# Patient Record
Sex: Male | Born: 1955
Health system: Southern US, Community
[De-identification: ages and names within clinical notes are randomized; demographics above are authoritative.]

## PROBLEM LIST (undated history)

## (undated) DIAGNOSIS — E785 Hyperlipidemia, unspecified: Secondary | ICD-10-CM

## (undated) DIAGNOSIS — I10 Essential (primary) hypertension: Secondary | ICD-10-CM

## (undated) DIAGNOSIS — M623 Immobility syndrome (paraplegic): Secondary | ICD-10-CM

## (undated) HISTORY — DX: Essential (primary) hypertension: I10

## (undated) HISTORY — PX: BACK SURGERY: SHX140

## (undated) HISTORY — DX: Hyperlipidemia, unspecified: E78.5

## (undated) HISTORY — DX: Immobility syndrome (paraplegic): M62.3

---

## 1998-10-07 ENCOUNTER — Inpatient Hospital Stay (HOSPITAL_COMMUNITY)
Admission: RE | Admit: 1998-10-07 | Discharge: 1998-10-30 | Payer: Self-pay | Admitting: Physical Medicine and Rehabilitation

## 1998-10-13 ENCOUNTER — Encounter: Payer: Self-pay | Admitting: Physical Medicine and Rehabilitation

## 1998-10-19 ENCOUNTER — Encounter: Payer: Self-pay | Admitting: Physical Medicine and Rehabilitation

## 1998-12-14 ENCOUNTER — Encounter
Admission: RE | Admit: 1998-12-14 | Discharge: 1999-03-14 | Payer: Self-pay | Admitting: Physical Medicine and Rehabilitation

## 2006-10-17 HISTORY — PX: COLONOSCOPY: SHX174

## 2011-12-21 DIAGNOSIS — D1739 Benign lipomatous neoplasm of skin and subcutaneous tissue of other sites: Secondary | ICD-10-CM | POA: Diagnosis not present

## 2012-01-03 DIAGNOSIS — R350 Frequency of micturition: Secondary | ICD-10-CM | POA: Diagnosis not present

## 2012-01-03 DIAGNOSIS — R509 Fever, unspecified: Secondary | ICD-10-CM | POA: Diagnosis not present

## 2012-01-05 DIAGNOSIS — G8220 Paraplegia, unspecified: Secondary | ICD-10-CM | POA: Diagnosis not present

## 2012-01-09 DIAGNOSIS — S63096A Other dislocation of unspecified wrist and hand, initial encounter: Secondary | ICD-10-CM | POA: Diagnosis not present

## 2012-01-09 DIAGNOSIS — S63509A Unspecified sprain of unspecified wrist, initial encounter: Secondary | ICD-10-CM | POA: Diagnosis not present

## 2012-01-09 DIAGNOSIS — M25539 Pain in unspecified wrist: Secondary | ICD-10-CM | POA: Diagnosis not present

## 2012-01-31 DIAGNOSIS — M5030 Other cervical disc degeneration, unspecified cervical region: Secondary | ICD-10-CM | POA: Diagnosis not present

## 2012-01-31 DIAGNOSIS — M5412 Radiculopathy, cervical region: Secondary | ICD-10-CM | POA: Diagnosis not present

## 2012-02-06 DIAGNOSIS — M5030 Other cervical disc degeneration, unspecified cervical region: Secondary | ICD-10-CM | POA: Diagnosis not present

## 2012-02-07 DIAGNOSIS — M5030 Other cervical disc degeneration, unspecified cervical region: Secondary | ICD-10-CM | POA: Diagnosis not present

## 2012-02-13 DIAGNOSIS — R6889 Other general symptoms and signs: Secondary | ICD-10-CM | POA: Diagnosis not present

## 2012-02-13 DIAGNOSIS — E041 Nontoxic single thyroid nodule: Secondary | ICD-10-CM | POA: Diagnosis not present

## 2012-02-15 DIAGNOSIS — E041 Nontoxic single thyroid nodule: Secondary | ICD-10-CM | POA: Diagnosis not present

## 2012-02-15 DIAGNOSIS — R6889 Other general symptoms and signs: Secondary | ICD-10-CM | POA: Diagnosis not present

## 2012-03-26 DIAGNOSIS — G890 Central pain syndrome: Secondary | ICD-10-CM | POA: Diagnosis not present

## 2012-03-26 DIAGNOSIS — M5030 Other cervical disc degeneration, unspecified cervical region: Secondary | ICD-10-CM | POA: Diagnosis not present

## 2012-03-26 DIAGNOSIS — M5137 Other intervertebral disc degeneration, lumbosacral region: Secondary | ICD-10-CM | POA: Diagnosis not present

## 2012-04-09 DIAGNOSIS — D485 Neoplasm of uncertain behavior of skin: Secondary | ICD-10-CM | POA: Diagnosis not present

## 2012-04-24 DIAGNOSIS — D2360 Other benign neoplasm of skin of unspecified upper limb, including shoulder: Secondary | ICD-10-CM | POA: Diagnosis not present

## 2012-04-26 DIAGNOSIS — M25529 Pain in unspecified elbow: Secondary | ICD-10-CM | POA: Diagnosis not present

## 2012-05-02 DIAGNOSIS — F4321 Adjustment disorder with depressed mood: Secondary | ICD-10-CM | POA: Diagnosis not present

## 2012-05-02 DIAGNOSIS — F4542 Pain disorder with related psychological factors: Secondary | ICD-10-CM | POA: Diagnosis not present

## 2012-05-09 DIAGNOSIS — F4321 Adjustment disorder with depressed mood: Secondary | ICD-10-CM | POA: Diagnosis not present

## 2012-05-09 DIAGNOSIS — F4542 Pain disorder with related psychological factors: Secondary | ICD-10-CM | POA: Diagnosis not present

## 2012-05-17 DIAGNOSIS — M25529 Pain in unspecified elbow: Secondary | ICD-10-CM | POA: Diagnosis not present

## 2012-06-05 DIAGNOSIS — G890 Central pain syndrome: Secondary | ICD-10-CM | POA: Diagnosis not present

## 2012-06-05 DIAGNOSIS — G8220 Paraplegia, unspecified: Secondary | ICD-10-CM | POA: Diagnosis not present

## 2012-06-05 DIAGNOSIS — IMO0002 Reserved for concepts with insufficient information to code with codable children: Secondary | ICD-10-CM | POA: Diagnosis not present

## 2012-07-12 DIAGNOSIS — M5137 Other intervertebral disc degeneration, lumbosacral region: Secondary | ICD-10-CM | POA: Diagnosis not present

## 2012-07-12 DIAGNOSIS — G894 Chronic pain syndrome: Secondary | ICD-10-CM | POA: Diagnosis not present

## 2012-07-12 DIAGNOSIS — G890 Central pain syndrome: Secondary | ICD-10-CM | POA: Diagnosis not present

## 2012-08-07 DIAGNOSIS — G894 Chronic pain syndrome: Secondary | ICD-10-CM | POA: Diagnosis not present

## 2012-08-07 DIAGNOSIS — M961 Postlaminectomy syndrome, not elsewhere classified: Secondary | ICD-10-CM | POA: Diagnosis not present

## 2012-08-07 DIAGNOSIS — R109 Unspecified abdominal pain: Secondary | ICD-10-CM | POA: Diagnosis not present

## 2012-08-07 DIAGNOSIS — M5137 Other intervertebral disc degeneration, lumbosacral region: Secondary | ICD-10-CM | POA: Diagnosis not present

## 2012-09-28 DIAGNOSIS — G8220 Paraplegia, unspecified: Secondary | ICD-10-CM | POA: Diagnosis not present

## 2012-09-28 DIAGNOSIS — E782 Mixed hyperlipidemia: Secondary | ICD-10-CM | POA: Diagnosis not present

## 2012-09-28 DIAGNOSIS — Z125 Encounter for screening for malignant neoplasm of prostate: Secondary | ICD-10-CM | POA: Diagnosis not present

## 2012-09-28 DIAGNOSIS — Z23 Encounter for immunization: Secondary | ICD-10-CM | POA: Diagnosis not present

## 2012-09-28 DIAGNOSIS — M25539 Pain in unspecified wrist: Secondary | ICD-10-CM | POA: Diagnosis not present

## 2012-09-28 DIAGNOSIS — Z79899 Other long term (current) drug therapy: Secondary | ICD-10-CM | POA: Diagnosis not present

## 2012-10-01 DIAGNOSIS — M79609 Pain in unspecified limb: Secondary | ICD-10-CM | POA: Diagnosis not present

## 2012-10-01 DIAGNOSIS — M249 Joint derangement, unspecified: Secondary | ICD-10-CM | POA: Diagnosis not present

## 2012-10-11 DIAGNOSIS — M47812 Spondylosis without myelopathy or radiculopathy, cervical region: Secondary | ICD-10-CM | POA: Diagnosis not present

## 2012-10-11 DIAGNOSIS — M24139 Other articular cartilage disorders, unspecified wrist: Secondary | ICD-10-CM | POA: Diagnosis not present

## 2012-10-19 DIAGNOSIS — S56819A Strain of other muscles, fascia and tendons at forearm level, unspecified arm, initial encounter: Secondary | ICD-10-CM | POA: Diagnosis not present

## 2012-10-19 DIAGNOSIS — M47812 Spondylosis without myelopathy or radiculopathy, cervical region: Secondary | ICD-10-CM | POA: Diagnosis not present

## 2012-10-19 DIAGNOSIS — S53499A Other sprain of unspecified elbow, initial encounter: Secondary | ICD-10-CM | POA: Diagnosis not present

## 2012-10-19 DIAGNOSIS — M25539 Pain in unspecified wrist: Secondary | ICD-10-CM | POA: Diagnosis not present

## 2013-01-15 DIAGNOSIS — N529 Male erectile dysfunction, unspecified: Secondary | ICD-10-CM | POA: Diagnosis not present

## 2013-01-15 DIAGNOSIS — N319 Neuromuscular dysfunction of bladder, unspecified: Secondary | ICD-10-CM | POA: Diagnosis not present

## 2013-01-15 DIAGNOSIS — Z125 Encounter for screening for malignant neoplasm of prostate: Secondary | ICD-10-CM | POA: Diagnosis not present

## 2013-05-09 DIAGNOSIS — N319 Neuromuscular dysfunction of bladder, unspecified: Secondary | ICD-10-CM | POA: Diagnosis not present

## 2013-05-09 DIAGNOSIS — G8220 Paraplegia, unspecified: Secondary | ICD-10-CM | POA: Diagnosis not present

## 2013-05-10 DIAGNOSIS — Z79899 Other long term (current) drug therapy: Secondary | ICD-10-CM | POA: Diagnosis not present

## 2013-05-10 DIAGNOSIS — T83091A Other mechanical complication of indwelling urethral catheter, initial encounter: Secondary | ICD-10-CM | POA: Diagnosis not present

## 2013-05-10 DIAGNOSIS — Z466 Encounter for fitting and adjustment of urinary device: Secondary | ICD-10-CM | POA: Diagnosis not present

## 2013-05-10 DIAGNOSIS — R339 Retention of urine, unspecified: Secondary | ICD-10-CM | POA: Diagnosis not present

## 2013-05-10 DIAGNOSIS — Y846 Urinary catheterization as the cause of abnormal reaction of the patient, or of later complication, without mention of misadventure at the time of the procedure: Secondary | ICD-10-CM | POA: Diagnosis not present

## 2013-05-10 DIAGNOSIS — T8389XA Other specified complication of genitourinary prosthetic devices, implants and grafts, initial encounter: Secondary | ICD-10-CM | POA: Diagnosis not present

## 2013-05-10 DIAGNOSIS — R3989 Other symptoms and signs involving the genitourinary system: Secondary | ICD-10-CM | POA: Diagnosis not present

## 2013-05-13 DIAGNOSIS — N390 Urinary tract infection, site not specified: Secondary | ICD-10-CM | POA: Diagnosis not present

## 2013-05-23 DIAGNOSIS — N529 Male erectile dysfunction, unspecified: Secondary | ICD-10-CM | POA: Diagnosis not present

## 2013-05-23 DIAGNOSIS — N390 Urinary tract infection, site not specified: Secondary | ICD-10-CM | POA: Diagnosis not present

## 2013-05-23 DIAGNOSIS — N319 Neuromuscular dysfunction of bladder, unspecified: Secondary | ICD-10-CM | POA: Diagnosis not present

## 2013-08-16 DIAGNOSIS — N3000 Acute cystitis without hematuria: Secondary | ICD-10-CM | POA: Diagnosis not present

## 2013-08-16 DIAGNOSIS — L570 Actinic keratosis: Secondary | ICD-10-CM | POA: Diagnosis not present

## 2013-08-16 DIAGNOSIS — E079 Disorder of thyroid, unspecified: Secondary | ICD-10-CM | POA: Diagnosis not present

## 2013-09-03 DIAGNOSIS — N3000 Acute cystitis without hematuria: Secondary | ICD-10-CM | POA: Diagnosis not present

## 2013-09-06 DIAGNOSIS — E079 Disorder of thyroid, unspecified: Secondary | ICD-10-CM | POA: Diagnosis not present

## 2013-09-06 DIAGNOSIS — E049 Nontoxic goiter, unspecified: Secondary | ICD-10-CM | POA: Diagnosis not present

## 2013-09-06 DIAGNOSIS — E041 Nontoxic single thyroid nodule: Secondary | ICD-10-CM | POA: Diagnosis not present

## 2013-09-06 DIAGNOSIS — E042 Nontoxic multinodular goiter: Secondary | ICD-10-CM | POA: Diagnosis not present

## 2013-09-30 DIAGNOSIS — I10 Essential (primary) hypertension: Secondary | ICD-10-CM | POA: Diagnosis not present

## 2013-09-30 DIAGNOSIS — G8220 Paraplegia, unspecified: Secondary | ICD-10-CM | POA: Diagnosis not present

## 2013-09-30 DIAGNOSIS — E782 Mixed hyperlipidemia: Secondary | ICD-10-CM | POA: Diagnosis not present

## 2013-09-30 DIAGNOSIS — L299 Pruritus, unspecified: Secondary | ICD-10-CM | POA: Diagnosis not present

## 2013-09-30 DIAGNOSIS — K219 Gastro-esophageal reflux disease without esophagitis: Secondary | ICD-10-CM | POA: Diagnosis not present

## 2013-09-30 DIAGNOSIS — Z125 Encounter for screening for malignant neoplasm of prostate: Secondary | ICD-10-CM | POA: Diagnosis not present

## 2013-11-13 DIAGNOSIS — L821 Other seborrheic keratosis: Secondary | ICD-10-CM | POA: Diagnosis not present

## 2013-11-13 DIAGNOSIS — C4441 Basal cell carcinoma of skin of scalp and neck: Secondary | ICD-10-CM | POA: Diagnosis not present

## 2014-03-17 DIAGNOSIS — L570 Actinic keratosis: Secondary | ICD-10-CM | POA: Diagnosis not present

## 2014-03-17 DIAGNOSIS — Z85828 Personal history of other malignant neoplasm of skin: Secondary | ICD-10-CM | POA: Diagnosis not present

## 2014-07-09 DIAGNOSIS — J209 Acute bronchitis, unspecified: Secondary | ICD-10-CM | POA: Diagnosis not present

## 2014-07-17 DIAGNOSIS — N400 Enlarged prostate without lower urinary tract symptoms: Secondary | ICD-10-CM | POA: Diagnosis not present

## 2014-07-17 DIAGNOSIS — N319 Neuromuscular dysfunction of bladder, unspecified: Secondary | ICD-10-CM | POA: Diagnosis not present

## 2014-10-02 DIAGNOSIS — Z79899 Other long term (current) drug therapy: Secondary | ICD-10-CM | POA: Diagnosis not present

## 2014-10-02 DIAGNOSIS — K219 Gastro-esophageal reflux disease without esophagitis: Secondary | ICD-10-CM | POA: Diagnosis not present

## 2014-10-02 DIAGNOSIS — Z23 Encounter for immunization: Secondary | ICD-10-CM | POA: Diagnosis not present

## 2014-10-02 DIAGNOSIS — E782 Mixed hyperlipidemia: Secondary | ICD-10-CM | POA: Diagnosis not present

## 2014-10-02 DIAGNOSIS — I10 Essential (primary) hypertension: Secondary | ICD-10-CM | POA: Diagnosis not present

## 2014-10-02 DIAGNOSIS — Z0000 Encounter for general adult medical examination without abnormal findings: Secondary | ICD-10-CM | POA: Diagnosis not present

## 2014-10-02 DIAGNOSIS — G8220 Paraplegia, unspecified: Secondary | ICD-10-CM | POA: Diagnosis not present

## 2015-02-09 DIAGNOSIS — R319 Hematuria, unspecified: Secondary | ICD-10-CM | POA: Diagnosis not present

## 2015-02-09 DIAGNOSIS — Z85828 Personal history of other malignant neoplasm of skin: Secondary | ICD-10-CM | POA: Diagnosis not present

## 2015-02-09 DIAGNOSIS — L570 Actinic keratosis: Secondary | ICD-10-CM | POA: Diagnosis not present

## 2015-02-09 DIAGNOSIS — N319 Neuromuscular dysfunction of bladder, unspecified: Secondary | ICD-10-CM | POA: Diagnosis not present

## 2015-02-09 DIAGNOSIS — N529 Male erectile dysfunction, unspecified: Secondary | ICD-10-CM | POA: Diagnosis not present

## 2015-02-09 DIAGNOSIS — Z08 Encounter for follow-up examination after completed treatment for malignant neoplasm: Secondary | ICD-10-CM | POA: Diagnosis not present

## 2015-02-09 DIAGNOSIS — N200 Calculus of kidney: Secondary | ICD-10-CM | POA: Diagnosis not present

## 2015-02-18 DIAGNOSIS — D485 Neoplasm of uncertain behavior of skin: Secondary | ICD-10-CM | POA: Diagnosis not present

## 2015-02-18 DIAGNOSIS — D171 Benign lipomatous neoplasm of skin and subcutaneous tissue of trunk: Secondary | ICD-10-CM | POA: Diagnosis not present

## 2015-02-18 DIAGNOSIS — D1722 Benign lipomatous neoplasm of skin and subcutaneous tissue of left arm: Secondary | ICD-10-CM | POA: Diagnosis not present

## 2015-06-08 DIAGNOSIS — E782 Mixed hyperlipidemia: Secondary | ICD-10-CM | POA: Diagnosis not present

## 2015-06-08 DIAGNOSIS — I10 Essential (primary) hypertension: Secondary | ICD-10-CM | POA: Diagnosis not present

## 2015-06-08 DIAGNOSIS — Z79899 Other long term (current) drug therapy: Secondary | ICD-10-CM | POA: Diagnosis not present

## 2015-06-08 DIAGNOSIS — R5383 Other fatigue: Secondary | ICD-10-CM | POA: Diagnosis not present

## 2015-09-25 DIAGNOSIS — Z79899 Other long term (current) drug therapy: Secondary | ICD-10-CM | POA: Diagnosis not present

## 2015-09-25 DIAGNOSIS — Z0000 Encounter for general adult medical examination without abnormal findings: Secondary | ICD-10-CM | POA: Diagnosis not present

## 2015-09-25 DIAGNOSIS — G8220 Paraplegia, unspecified: Secondary | ICD-10-CM | POA: Diagnosis not present

## 2015-09-25 DIAGNOSIS — L821 Other seborrheic keratosis: Secondary | ICD-10-CM | POA: Diagnosis not present

## 2015-09-25 DIAGNOSIS — I10 Essential (primary) hypertension: Secondary | ICD-10-CM | POA: Diagnosis not present

## 2015-09-25 DIAGNOSIS — E782 Mixed hyperlipidemia: Secondary | ICD-10-CM | POA: Diagnosis not present

## 2015-09-25 DIAGNOSIS — Z23 Encounter for immunization: Secondary | ICD-10-CM | POA: Diagnosis not present

## 2016-01-05 DIAGNOSIS — Z0000 Encounter for general adult medical examination without abnormal findings: Secondary | ICD-10-CM | POA: Diagnosis not present

## 2016-01-05 DIAGNOSIS — G839 Paralytic syndrome, unspecified: Secondary | ICD-10-CM | POA: Diagnosis not present

## 2016-01-05 DIAGNOSIS — I10 Essential (primary) hypertension: Secondary | ICD-10-CM | POA: Diagnosis not present

## 2016-01-05 DIAGNOSIS — K219 Gastro-esophageal reflux disease without esophagitis: Secondary | ICD-10-CM | POA: Diagnosis not present

## 2016-01-05 DIAGNOSIS — E559 Vitamin D deficiency, unspecified: Secondary | ICD-10-CM | POA: Diagnosis not present

## 2016-01-05 DIAGNOSIS — E782 Mixed hyperlipidemia: Secondary | ICD-10-CM | POA: Diagnosis not present

## 2016-01-05 DIAGNOSIS — Z125 Encounter for screening for malignant neoplasm of prostate: Secondary | ICD-10-CM | POA: Diagnosis not present

## 2016-01-05 DIAGNOSIS — Z79899 Other long term (current) drug therapy: Secondary | ICD-10-CM | POA: Diagnosis not present

## 2016-03-03 DIAGNOSIS — E782 Mixed hyperlipidemia: Secondary | ICD-10-CM | POA: Diagnosis not present

## 2016-03-03 DIAGNOSIS — Z79899 Other long term (current) drug therapy: Secondary | ICD-10-CM | POA: Diagnosis not present

## 2016-03-03 DIAGNOSIS — D519 Vitamin B12 deficiency anemia, unspecified: Secondary | ICD-10-CM | POA: Diagnosis not present

## 2016-03-03 DIAGNOSIS — R5383 Other fatigue: Secondary | ICD-10-CM | POA: Diagnosis not present

## 2016-03-11 DIAGNOSIS — E782 Mixed hyperlipidemia: Secondary | ICD-10-CM | POA: Diagnosis not present

## 2016-03-11 DIAGNOSIS — Z0000 Encounter for general adult medical examination without abnormal findings: Secondary | ICD-10-CM | POA: Diagnosis not present

## 2016-03-11 DIAGNOSIS — R5383 Other fatigue: Secondary | ICD-10-CM | POA: Diagnosis not present

## 2016-04-29 DIAGNOSIS — E291 Testicular hypofunction: Secondary | ICD-10-CM | POA: Diagnosis not present

## 2016-04-29 DIAGNOSIS — Z1389 Encounter for screening for other disorder: Secondary | ICD-10-CM | POA: Diagnosis not present

## 2016-04-29 DIAGNOSIS — Z9181 History of falling: Secondary | ICD-10-CM | POA: Diagnosis not present

## 2016-09-15 DIAGNOSIS — M7582 Other shoulder lesions, left shoulder: Secondary | ICD-10-CM | POA: Diagnosis not present

## 2016-09-15 DIAGNOSIS — E782 Mixed hyperlipidemia: Secondary | ICD-10-CM | POA: Diagnosis not present

## 2016-09-15 DIAGNOSIS — I10 Essential (primary) hypertension: Secondary | ICD-10-CM | POA: Diagnosis not present

## 2016-09-15 DIAGNOSIS — Z1389 Encounter for screening for other disorder: Secondary | ICD-10-CM | POA: Diagnosis not present

## 2016-10-03 DIAGNOSIS — N319 Neuromuscular dysfunction of bladder, unspecified: Secondary | ICD-10-CM | POA: Diagnosis not present

## 2016-10-27 DIAGNOSIS — H547 Unspecified visual loss: Secondary | ICD-10-CM | POA: Diagnosis not present

## 2016-10-27 DIAGNOSIS — H524 Presbyopia: Secondary | ICD-10-CM | POA: Diagnosis not present

## 2016-10-27 DIAGNOSIS — H5203 Hypermetropia, bilateral: Secondary | ICD-10-CM | POA: Diagnosis not present

## 2016-11-21 DIAGNOSIS — M25512 Pain in left shoulder: Secondary | ICD-10-CM | POA: Diagnosis not present

## 2016-11-21 DIAGNOSIS — Z9181 History of falling: Secondary | ICD-10-CM | POA: Diagnosis not present

## 2016-11-21 DIAGNOSIS — Z1389 Encounter for screening for other disorder: Secondary | ICD-10-CM | POA: Diagnosis not present

## 2016-11-28 DIAGNOSIS — M7542 Impingement syndrome of left shoulder: Secondary | ICD-10-CM | POA: Diagnosis not present

## 2017-01-06 DIAGNOSIS — Z79899 Other long term (current) drug therapy: Secondary | ICD-10-CM | POA: Diagnosis not present

## 2017-01-06 DIAGNOSIS — E782 Mixed hyperlipidemia: Secondary | ICD-10-CM | POA: Diagnosis not present

## 2017-01-06 DIAGNOSIS — Z13820 Encounter for screening for osteoporosis: Secondary | ICD-10-CM | POA: Diagnosis not present

## 2017-01-06 DIAGNOSIS — M859 Disorder of bone density and structure, unspecified: Secondary | ICD-10-CM | POA: Diagnosis not present

## 2017-01-06 DIAGNOSIS — G90529 Complex regional pain syndrome I of unspecified lower limb: Secondary | ICD-10-CM | POA: Diagnosis not present

## 2017-01-06 DIAGNOSIS — M8580 Other specified disorders of bone density and structure, unspecified site: Secondary | ICD-10-CM | POA: Diagnosis not present

## 2017-01-06 DIAGNOSIS — Z125 Encounter for screening for malignant neoplasm of prostate: Secondary | ICD-10-CM | POA: Diagnosis not present

## 2017-01-16 DIAGNOSIS — G9050 Complex regional pain syndrome I, unspecified: Secondary | ICD-10-CM | POA: Diagnosis not present

## 2017-01-16 DIAGNOSIS — R0989 Other specified symptoms and signs involving the circulatory and respiratory systems: Secondary | ICD-10-CM | POA: Diagnosis not present

## 2017-01-16 DIAGNOSIS — R202 Paresthesia of skin: Secondary | ICD-10-CM | POA: Diagnosis not present

## 2017-03-17 DIAGNOSIS — T24231A Burn of second degree of right lower leg, initial encounter: Secondary | ICD-10-CM | POA: Diagnosis not present

## 2017-03-17 DIAGNOSIS — G8220 Paraplegia, unspecified: Secondary | ICD-10-CM | POA: Diagnosis not present

## 2017-03-17 DIAGNOSIS — T24031A Burn of unspecified degree of right lower leg, initial encounter: Secondary | ICD-10-CM | POA: Diagnosis not present

## 2017-03-17 DIAGNOSIS — I10 Essential (primary) hypertension: Secondary | ICD-10-CM | POA: Diagnosis not present

## 2017-03-17 DIAGNOSIS — T24331A Burn of third degree of right lower leg, initial encounter: Secondary | ICD-10-CM | POA: Diagnosis not present

## 2017-03-24 DIAGNOSIS — T24331A Burn of third degree of right lower leg, initial encounter: Secondary | ICD-10-CM | POA: Diagnosis not present

## 2017-03-31 DIAGNOSIS — T24331A Burn of third degree of right lower leg, initial encounter: Secondary | ICD-10-CM | POA: Diagnosis not present

## 2017-03-31 DIAGNOSIS — T24301D Burn of third degree of unspecified site of right lower limb, except ankle and foot, subsequent encounter: Secondary | ICD-10-CM | POA: Diagnosis not present

## 2017-03-31 DIAGNOSIS — T310 Burns involving less than 10% of body surface: Secondary | ICD-10-CM | POA: Diagnosis not present

## 2017-04-07 DIAGNOSIS — T24331A Burn of third degree of right lower leg, initial encounter: Secondary | ICD-10-CM | POA: Diagnosis not present

## 2017-04-07 DIAGNOSIS — T24301A Burn of third degree of unspecified site of right lower limb, except ankle and foot, initial encounter: Secondary | ICD-10-CM | POA: Diagnosis not present

## 2017-04-17 DIAGNOSIS — T24331A Burn of third degree of right lower leg, initial encounter: Secondary | ICD-10-CM | POA: Diagnosis not present

## 2017-04-24 DIAGNOSIS — T24031D Burn of unspecified degree of right lower leg, subsequent encounter: Secondary | ICD-10-CM | POA: Diagnosis not present

## 2017-04-24 DIAGNOSIS — Z09 Encounter for follow-up examination after completed treatment for conditions other than malignant neoplasm: Secondary | ICD-10-CM | POA: Diagnosis not present

## 2017-07-19 DIAGNOSIS — Z23 Encounter for immunization: Secondary | ICD-10-CM | POA: Diagnosis not present

## 2017-07-19 DIAGNOSIS — E782 Mixed hyperlipidemia: Secondary | ICD-10-CM | POA: Diagnosis not present

## 2017-07-19 DIAGNOSIS — E291 Testicular hypofunction: Secondary | ICD-10-CM | POA: Diagnosis not present

## 2017-07-19 DIAGNOSIS — I10 Essential (primary) hypertension: Secondary | ICD-10-CM | POA: Diagnosis not present

## 2017-08-21 DIAGNOSIS — K6289 Other specified diseases of anus and rectum: Secondary | ICD-10-CM | POA: Diagnosis not present

## 2017-08-21 DIAGNOSIS — R102 Pelvic and perineal pain: Secondary | ICD-10-CM | POA: Diagnosis not present

## 2017-08-21 DIAGNOSIS — Z9181 History of falling: Secondary | ICD-10-CM | POA: Diagnosis not present

## 2017-08-21 DIAGNOSIS — G8220 Paraplegia, unspecified: Secondary | ICD-10-CM | POA: Diagnosis not present

## 2017-08-25 DIAGNOSIS — L89152 Pressure ulcer of sacral region, stage 2: Secondary | ICD-10-CM | POA: Diagnosis not present

## 2017-09-01 DIAGNOSIS — L89152 Pressure ulcer of sacral region, stage 2: Secondary | ICD-10-CM | POA: Diagnosis not present

## 2017-09-05 DIAGNOSIS — M792 Neuralgia and neuritis, unspecified: Secondary | ICD-10-CM | POA: Diagnosis not present

## 2017-09-05 DIAGNOSIS — G894 Chronic pain syndrome: Secondary | ICD-10-CM | POA: Diagnosis not present

## 2017-09-05 DIAGNOSIS — K6289 Other specified diseases of anus and rectum: Secondary | ICD-10-CM | POA: Diagnosis not present

## 2017-09-05 DIAGNOSIS — R102 Pelvic and perineal pain: Secondary | ICD-10-CM | POA: Diagnosis not present

## 2017-09-08 DIAGNOSIS — L89152 Pressure ulcer of sacral region, stage 2: Secondary | ICD-10-CM | POA: Diagnosis not present

## 2017-09-11 DIAGNOSIS — N3001 Acute cystitis with hematuria: Secondary | ICD-10-CM | POA: Diagnosis not present

## 2017-09-11 DIAGNOSIS — R350 Frequency of micturition: Secondary | ICD-10-CM | POA: Diagnosis not present

## 2017-09-22 DIAGNOSIS — L89152 Pressure ulcer of sacral region, stage 2: Secondary | ICD-10-CM | POA: Diagnosis not present

## 2017-09-29 DIAGNOSIS — R2689 Other abnormalities of gait and mobility: Secondary | ICD-10-CM | POA: Diagnosis not present

## 2017-09-29 DIAGNOSIS — G8220 Paraplegia, unspecified: Secondary | ICD-10-CM | POA: Diagnosis not present

## 2017-10-02 DIAGNOSIS — Z1331 Encounter for screening for depression: Secondary | ICD-10-CM | POA: Diagnosis not present

## 2017-10-02 DIAGNOSIS — M1811 Unilateral primary osteoarthritis of first carpometacarpal joint, right hand: Secondary | ICD-10-CM | POA: Diagnosis not present

## 2017-10-05 DIAGNOSIS — L89152 Pressure ulcer of sacral region, stage 2: Secondary | ICD-10-CM | POA: Diagnosis not present

## 2017-10-27 DIAGNOSIS — L89152 Pressure ulcer of sacral region, stage 2: Secondary | ICD-10-CM | POA: Diagnosis not present

## 2017-10-27 DIAGNOSIS — M623 Immobility syndrome (paraplegic): Secondary | ICD-10-CM | POA: Diagnosis not present

## 2017-11-03 DIAGNOSIS — M623 Immobility syndrome (paraplegic): Secondary | ICD-10-CM | POA: Diagnosis not present

## 2017-11-03 DIAGNOSIS — L89152 Pressure ulcer of sacral region, stage 2: Secondary | ICD-10-CM | POA: Diagnosis not present

## 2017-11-13 DIAGNOSIS — R300 Dysuria: Secondary | ICD-10-CM | POA: Diagnosis not present

## 2017-11-17 DIAGNOSIS — L570 Actinic keratosis: Secondary | ICD-10-CM | POA: Diagnosis not present

## 2017-11-17 DIAGNOSIS — M623 Immobility syndrome (paraplegic): Secondary | ICD-10-CM | POA: Diagnosis not present

## 2017-11-17 DIAGNOSIS — D1721 Benign lipomatous neoplasm of skin and subcutaneous tissue of right arm: Secondary | ICD-10-CM | POA: Diagnosis not present

## 2017-11-17 DIAGNOSIS — L89152 Pressure ulcer of sacral region, stage 2: Secondary | ICD-10-CM | POA: Diagnosis not present

## 2017-11-30 DIAGNOSIS — D485 Neoplasm of uncertain behavior of skin: Secondary | ICD-10-CM | POA: Diagnosis not present

## 2017-11-30 DIAGNOSIS — D1723 Benign lipomatous neoplasm of skin and subcutaneous tissue of right leg: Secondary | ICD-10-CM | POA: Diagnosis not present

## 2017-11-30 DIAGNOSIS — D1721 Benign lipomatous neoplasm of skin and subcutaneous tissue of right arm: Secondary | ICD-10-CM | POA: Diagnosis not present

## 2017-12-01 DIAGNOSIS — L89152 Pressure ulcer of sacral region, stage 2: Secondary | ICD-10-CM | POA: Diagnosis not present

## 2017-12-01 DIAGNOSIS — M623 Immobility syndrome (paraplegic): Secondary | ICD-10-CM | POA: Diagnosis not present

## 2017-12-08 DIAGNOSIS — M623 Immobility syndrome (paraplegic): Secondary | ICD-10-CM | POA: Diagnosis not present

## 2017-12-08 DIAGNOSIS — L89152 Pressure ulcer of sacral region, stage 2: Secondary | ICD-10-CM | POA: Diagnosis not present

## 2017-12-22 DIAGNOSIS — L89152 Pressure ulcer of sacral region, stage 2: Secondary | ICD-10-CM | POA: Diagnosis not present

## 2017-12-22 DIAGNOSIS — M623 Immobility syndrome (paraplegic): Secondary | ICD-10-CM | POA: Diagnosis not present

## 2017-12-29 DIAGNOSIS — M623 Immobility syndrome (paraplegic): Secondary | ICD-10-CM | POA: Diagnosis not present

## 2017-12-29 DIAGNOSIS — L89152 Pressure ulcer of sacral region, stage 2: Secondary | ICD-10-CM | POA: Diagnosis not present

## 2018-01-03 DIAGNOSIS — N400 Enlarged prostate without lower urinary tract symptoms: Secondary | ICD-10-CM | POA: Diagnosis not present

## 2018-01-03 DIAGNOSIS — Z87442 Personal history of urinary calculi: Secondary | ICD-10-CM | POA: Diagnosis not present

## 2018-01-03 DIAGNOSIS — R8290 Unspecified abnormal findings in urine: Secondary | ICD-10-CM | POA: Diagnosis not present

## 2018-01-03 DIAGNOSIS — N319 Neuromuscular dysfunction of bladder, unspecified: Secondary | ICD-10-CM | POA: Diagnosis not present

## 2018-01-09 ENCOUNTER — Encounter (HOSPITAL_BASED_OUTPATIENT_CLINIC_OR_DEPARTMENT_OTHER): Payer: Medicare Other | Attending: Internal Medicine

## 2018-01-09 DIAGNOSIS — L89153 Pressure ulcer of sacral region, stage 3: Secondary | ICD-10-CM | POA: Diagnosis not present

## 2018-01-09 DIAGNOSIS — I10 Essential (primary) hypertension: Secondary | ICD-10-CM | POA: Insufficient documentation

## 2018-01-09 DIAGNOSIS — G8220 Paraplegia, unspecified: Secondary | ICD-10-CM | POA: Insufficient documentation

## 2018-01-11 DIAGNOSIS — N319 Neuromuscular dysfunction of bladder, unspecified: Secondary | ICD-10-CM | POA: Diagnosis not present

## 2018-01-23 ENCOUNTER — Encounter (HOSPITAL_BASED_OUTPATIENT_CLINIC_OR_DEPARTMENT_OTHER): Payer: Medicare Other | Attending: Internal Medicine

## 2018-01-23 DIAGNOSIS — W14XXXS Fall from tree, sequela: Secondary | ICD-10-CM | POA: Insufficient documentation

## 2018-01-23 DIAGNOSIS — S34111S Complete lesion of L1 level of lumbar spinal cord, sequela: Secondary | ICD-10-CM | POA: Diagnosis not present

## 2018-01-23 DIAGNOSIS — L89153 Pressure ulcer of sacral region, stage 3: Secondary | ICD-10-CM | POA: Diagnosis not present

## 2018-02-26 DIAGNOSIS — R69 Illness, unspecified: Secondary | ICD-10-CM | POA: Diagnosis not present

## 2018-02-26 DIAGNOSIS — R300 Dysuria: Secondary | ICD-10-CM | POA: Diagnosis not present

## 2018-02-26 DIAGNOSIS — S61409A Unspecified open wound of unspecified hand, initial encounter: Secondary | ICD-10-CM | POA: Diagnosis not present

## 2018-05-07 DIAGNOSIS — I10 Essential (primary) hypertension: Secondary | ICD-10-CM | POA: Diagnosis not present

## 2018-05-07 DIAGNOSIS — N390 Urinary tract infection, site not specified: Secondary | ICD-10-CM | POA: Diagnosis not present

## 2018-05-07 DIAGNOSIS — E291 Testicular hypofunction: Secondary | ICD-10-CM | POA: Diagnosis not present

## 2018-07-24 DIAGNOSIS — Z23 Encounter for immunization: Secondary | ICD-10-CM | POA: Diagnosis not present

## 2018-07-24 DIAGNOSIS — N390 Urinary tract infection, site not specified: Secondary | ICD-10-CM | POA: Diagnosis not present

## 2018-10-11 DIAGNOSIS — N3000 Acute cystitis without hematuria: Secondary | ICD-10-CM | POA: Diagnosis not present

## 2018-10-12 DIAGNOSIS — K6289 Other specified diseases of anus and rectum: Secondary | ICD-10-CM | POA: Diagnosis not present

## 2018-10-12 DIAGNOSIS — G894 Chronic pain syndrome: Secondary | ICD-10-CM | POA: Diagnosis not present

## 2018-11-02 DIAGNOSIS — K6289 Other specified diseases of anus and rectum: Secondary | ICD-10-CM | POA: Diagnosis not present

## 2018-11-20 DIAGNOSIS — N318 Other neuromuscular dysfunction of bladder: Secondary | ICD-10-CM | POA: Diagnosis not present

## 2018-11-20 DIAGNOSIS — Z87440 Personal history of urinary (tract) infections: Secondary | ICD-10-CM | POA: Diagnosis not present

## 2018-11-20 DIAGNOSIS — N35814 Other anterior urethral stricture, male: Secondary | ICD-10-CM | POA: Diagnosis not present

## 2018-11-20 DIAGNOSIS — N319 Neuromuscular dysfunction of bladder, unspecified: Secondary | ICD-10-CM | POA: Diagnosis not present

## 2018-11-20 DIAGNOSIS — Z87442 Personal history of urinary calculi: Secondary | ICD-10-CM | POA: Diagnosis not present

## 2018-11-20 DIAGNOSIS — Z969 Presence of functional implant, unspecified: Secondary | ICD-10-CM | POA: Diagnosis not present

## 2018-11-26 DIAGNOSIS — K6289 Other specified diseases of anus and rectum: Secondary | ICD-10-CM | POA: Diagnosis not present

## 2018-11-26 DIAGNOSIS — G894 Chronic pain syndrome: Secondary | ICD-10-CM | POA: Diagnosis not present

## 2018-11-26 DIAGNOSIS — M792 Neuralgia and neuritis, unspecified: Secondary | ICD-10-CM | POA: Diagnosis not present

## 2018-11-26 DIAGNOSIS — G8929 Other chronic pain: Secondary | ICD-10-CM | POA: Diagnosis not present

## 2018-11-26 DIAGNOSIS — S24113A Complete lesion at T7-T10 level of thoracic spinal cord, initial encounter: Secondary | ICD-10-CM | POA: Diagnosis not present

## 2018-12-18 DIAGNOSIS — Z0000 Encounter for general adult medical examination without abnormal findings: Secondary | ICD-10-CM | POA: Diagnosis not present

## 2018-12-18 DIAGNOSIS — Z125 Encounter for screening for malignant neoplasm of prostate: Secondary | ICD-10-CM | POA: Diagnosis not present

## 2018-12-18 DIAGNOSIS — G8220 Paraplegia, unspecified: Secondary | ICD-10-CM | POA: Diagnosis not present

## 2018-12-18 DIAGNOSIS — E782 Mixed hyperlipidemia: Secondary | ICD-10-CM | POA: Diagnosis not present

## 2018-12-18 DIAGNOSIS — Z23 Encounter for immunization: Secondary | ICD-10-CM | POA: Diagnosis not present

## 2018-12-18 DIAGNOSIS — Z79899 Other long term (current) drug therapy: Secondary | ICD-10-CM | POA: Diagnosis not present

## 2018-12-18 DIAGNOSIS — I10 Essential (primary) hypertension: Secondary | ICD-10-CM | POA: Diagnosis not present

## 2018-12-18 DIAGNOSIS — E785 Hyperlipidemia, unspecified: Secondary | ICD-10-CM | POA: Diagnosis not present

## 2019-02-27 DIAGNOSIS — N319 Neuromuscular dysfunction of bladder, unspecified: Secondary | ICD-10-CM | POA: Diagnosis not present

## 2019-06-25 DIAGNOSIS — S61002A Unspecified open wound of left thumb without damage to nail, initial encounter: Secondary | ICD-10-CM | POA: Diagnosis not present

## 2019-07-29 DIAGNOSIS — N3001 Acute cystitis with hematuria: Secondary | ICD-10-CM | POA: Diagnosis not present

## 2019-07-29 DIAGNOSIS — N3090 Cystitis, unspecified without hematuria: Secondary | ICD-10-CM | POA: Diagnosis not present

## 2019-08-12 DIAGNOSIS — Z23 Encounter for immunization: Secondary | ICD-10-CM | POA: Diagnosis not present

## 2019-09-19 DIAGNOSIS — I10 Essential (primary) hypertension: Secondary | ICD-10-CM | POA: Diagnosis not present

## 2019-09-19 DIAGNOSIS — E782 Mixed hyperlipidemia: Secondary | ICD-10-CM | POA: Diagnosis not present

## 2019-09-19 DIAGNOSIS — N3000 Acute cystitis without hematuria: Secondary | ICD-10-CM | POA: Diagnosis not present

## 2019-09-19 DIAGNOSIS — G8220 Paraplegia, unspecified: Secondary | ICD-10-CM | POA: Diagnosis not present

## 2019-09-20 DIAGNOSIS — N3000 Acute cystitis without hematuria: Secondary | ICD-10-CM | POA: Diagnosis not present

## 2019-11-12 DIAGNOSIS — T783XXA Angioneurotic edema, initial encounter: Secondary | ICD-10-CM | POA: Diagnosis not present

## 2019-12-25 DIAGNOSIS — Z79899 Other long term (current) drug therapy: Secondary | ICD-10-CM | POA: Diagnosis not present

## 2019-12-25 DIAGNOSIS — N390 Urinary tract infection, site not specified: Secondary | ICD-10-CM | POA: Diagnosis not present

## 2019-12-25 DIAGNOSIS — I10 Essential (primary) hypertension: Secondary | ICD-10-CM | POA: Diagnosis not present

## 2019-12-25 DIAGNOSIS — G8220 Paraplegia, unspecified: Secondary | ICD-10-CM | POA: Diagnosis not present

## 2019-12-25 DIAGNOSIS — Z0000 Encounter for general adult medical examination without abnormal findings: Secondary | ICD-10-CM | POA: Diagnosis not present

## 2019-12-25 DIAGNOSIS — Z125 Encounter for screening for malignant neoplasm of prostate: Secondary | ICD-10-CM | POA: Diagnosis not present

## 2019-12-25 DIAGNOSIS — E782 Mixed hyperlipidemia: Secondary | ICD-10-CM | POA: Diagnosis not present

## 2019-12-26 DIAGNOSIS — Z23 Encounter for immunization: Secondary | ICD-10-CM | POA: Diagnosis not present

## 2020-01-08 DIAGNOSIS — R21 Rash and other nonspecific skin eruption: Secondary | ICD-10-CM | POA: Diagnosis not present

## 2020-01-08 DIAGNOSIS — N3001 Acute cystitis with hematuria: Secondary | ICD-10-CM | POA: Diagnosis not present

## 2020-01-13 DIAGNOSIS — Z1211 Encounter for screening for malignant neoplasm of colon: Secondary | ICD-10-CM | POA: Diagnosis not present

## 2020-01-13 DIAGNOSIS — Z1212 Encounter for screening for malignant neoplasm of rectum: Secondary | ICD-10-CM | POA: Diagnosis not present

## 2020-01-30 DIAGNOSIS — Z23 Encounter for immunization: Secondary | ICD-10-CM | POA: Diagnosis not present

## 2020-03-03 DIAGNOSIS — N319 Neuromuscular dysfunction of bladder, unspecified: Secondary | ICD-10-CM | POA: Diagnosis not present

## 2020-03-27 ENCOUNTER — Encounter: Payer: Self-pay | Admitting: Gastroenterology

## 2020-03-30 DIAGNOSIS — N3001 Acute cystitis with hematuria: Secondary | ICD-10-CM | POA: Diagnosis not present

## 2020-05-19 DIAGNOSIS — N35919 Unspecified urethral stricture, male, unspecified site: Secondary | ICD-10-CM | POA: Diagnosis not present

## 2020-05-19 DIAGNOSIS — N319 Neuromuscular dysfunction of bladder, unspecified: Secondary | ICD-10-CM | POA: Diagnosis not present

## 2020-05-19 DIAGNOSIS — I498 Other specified cardiac arrhythmias: Secondary | ICD-10-CM | POA: Diagnosis not present

## 2020-05-19 HISTORY — PX: CYSTOSCOPY: SUR368

## 2020-05-25 ENCOUNTER — Ambulatory Visit (INDEPENDENT_AMBULATORY_CARE_PROVIDER_SITE_OTHER): Payer: Medicare Other | Admitting: Gastroenterology

## 2020-05-25 ENCOUNTER — Other Ambulatory Visit: Payer: Self-pay

## 2020-05-25 VITALS — BP 148/80 | HR 73 | Ht 72.0 in | Wt 190.0 lb

## 2020-05-25 DIAGNOSIS — R195 Other fecal abnormalities: Secondary | ICD-10-CM

## 2020-05-25 MED ORDER — CLENPIQ 10-3.5-12 MG-GM -GM/160ML PO SOLN
1.0000 | Freq: Once | ORAL | 0 refills | Status: AC
Start: 2020-05-25 — End: 2020-05-25

## 2020-05-25 NOTE — Patient Instructions (Signed)
You have been scheduled for a colonoscopy. Please follow written instructions given to you at your visit today.  Please pick up your prep supplies at the pharmacy within the next 1-3 days. If you use inhalers (even only as needed), please bring them with you on the day of your procedure.  Thank you,  Dr. Jackquline Denmark
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
## 2020-05-29 DIAGNOSIS — N319 Neuromuscular dysfunction of bladder, unspecified: Secondary | ICD-10-CM | POA: Diagnosis not present

## 2020-06-12 ENCOUNTER — Other Ambulatory Visit: Payer: Self-pay | Admitting: Gastroenterology

## 2020-06-12 ENCOUNTER — Telehealth: Payer: Self-pay | Admitting: Gastroenterology

## 2020-06-12 DIAGNOSIS — R1084 Generalized abdominal pain: Secondary | ICD-10-CM

## 2020-06-12 DIAGNOSIS — R109 Unspecified abdominal pain: Secondary | ICD-10-CM | POA: Diagnosis not present

## 2020-06-12 NOTE — Telephone Encounter (Signed)
Patient is paraplegic. He does get around with electric wheelchair Increasing left lower quadrant abdominal pain with tenderness  Lets -Check CBC, CMP and lipase -Proceed with CT Abdo/pelvis with p.o. and IV contrast prior to colonoscopy  RG

## 2020-06-12 NOTE — Telephone Encounter (Signed)
LMOM for patient to call back.

## 2020-06-12 NOTE — Telephone Encounter (Signed)
Spoke to patient who reports left lower abdominal pain. He states that the pain is noted worse while having a bowel movement and diving his vehicle. No nausea vomiting or fever or tender to touch. Patient has become very concerned with this increasing pain. He has an upcoming colonoscopy at [facility] in October, but was wondering if further testing should be done. Dr Lyndel Safe please advise.

## 2020-06-15 ENCOUNTER — Other Ambulatory Visit: Payer: Self-pay | Admitting: Gastroenterology

## 2020-06-15 DIAGNOSIS — R1032 Left lower quadrant pain: Secondary | ICD-10-CM

## 2020-06-15 NOTE — Telephone Encounter (Signed)
Spoke to patient to inform him of Dr Leland Her recommendations. He will has Ct Abdomin and pelvis with contrast on 06/29/20 Labs will be done the week before. Patient will go by the [facility] office to pick up his contrast and have labs done on the same day. He said he will try to bet there on 9/6 or 9/7. All questions answered patient voiced understanding.

## 2020-06-24 ENCOUNTER — Telehealth: Payer: Self-pay | Admitting: Gastroenterology

## 2020-06-24 NOTE — Telephone Encounter (Signed)
Spoke to patient to inform him of his upcoming colonoscopy for October 18th at [facility] questions answered. Patient voiced understanding.

## 2020-06-29 ENCOUNTER — Ambulatory Visit (HOSPITAL_COMMUNITY)
Admission: RE | Admit: 2020-06-29 | Discharge: 2020-06-29 | Disposition: A | Discharge status: Home / Self Care | Payer: Medicare Other | Source: Ambulatory Visit | Attending: Gastroenterology | Admitting: Gastroenterology

## 2020-06-29 ENCOUNTER — Encounter (HOSPITAL_COMMUNITY): Payer: Self-pay

## 2020-06-29 ENCOUNTER — Other Ambulatory Visit: Payer: Self-pay

## 2020-06-29 DIAGNOSIS — K5730 Diverticulosis of large intestine without perforation or abscess without bleeding: Secondary | ICD-10-CM | POA: Diagnosis not present

## 2020-06-29 DIAGNOSIS — R1032 Left lower quadrant pain: Secondary | ICD-10-CM | POA: Insufficient documentation

## 2020-06-29 DIAGNOSIS — N323 Diverticulum of bladder: Secondary | ICD-10-CM | POA: Diagnosis not present

## 2020-06-29 DIAGNOSIS — N3289 Other specified disorders of bladder: Secondary | ICD-10-CM | POA: Diagnosis not present

## 2020-06-29 DIAGNOSIS — N400 Enlarged prostate without lower urinary tract symptoms: Secondary | ICD-10-CM | POA: Diagnosis not present

## 2020-06-29 LAB — POCT I-STAT CREATININE: Creatinine, Ser: 0.6 mg/dL — ABNORMAL LOW (ref 0.61–1.24)

## 2020-06-29 MED ORDER — IOHEXOL 300 MG/ML  SOLN
100.0000 mL | Freq: Once | INTRAMUSCULAR | Status: AC | PRN
  Administered 2020-06-29: 100 mL via INTRAVENOUS

## 2020-07-20 ENCOUNTER — Encounter (HOSPITAL_COMMUNITY): Payer: Self-pay | Admitting: Gastroenterology

## 2020-07-20 NOTE — Progress Notes (Signed)
LM for patient to call back regarding his colonoscopy with Dr Lyndel Safe for 08/03/20.

## 2020-07-22 DIAGNOSIS — G8220 Paraplegia, unspecified: Secondary | ICD-10-CM | POA: Diagnosis not present

## 2020-07-22 DIAGNOSIS — N3001 Acute cystitis with hematuria: Secondary | ICD-10-CM | POA: Diagnosis not present

## 2020-07-30 ENCOUNTER — Other Ambulatory Visit (HOSPITAL_COMMUNITY)
Admission: RE | Admit: 2020-07-30 | Discharge: 2020-07-30 | Disposition: A | Discharge status: Home / Self Care | Payer: Medicare Other | Source: Ambulatory Visit | Attending: Gastroenterology | Admitting: Gastroenterology

## 2020-07-30 DIAGNOSIS — Z20822 Contact with and (suspected) exposure to covid-19: Secondary | ICD-10-CM | POA: Diagnosis not present

## 2020-07-30 DIAGNOSIS — Z01812 Encounter for preprocedural laboratory examination: Secondary | ICD-10-CM | POA: Insufficient documentation

## 2020-07-30 LAB — SARS CORONAVIRUS 2 (TAT 6-24 HRS): SARS Coronavirus 2: NEGATIVE

## 2020-08-03 ENCOUNTER — Encounter (HOSPITAL_COMMUNITY): Payer: Self-pay | Admitting: Registered Nurse

## 2020-08-03 ENCOUNTER — Ambulatory Visit (HOSPITAL_COMMUNITY)
Admission: RE | Admit: 2020-08-03 | Discharge: 2020-08-03 | Disposition: A | Discharge status: Home / Self Care | Payer: Medicare Other | Attending: Gastroenterology | Admitting: Gastroenterology

## 2020-08-03 ENCOUNTER — Encounter (HOSPITAL_COMMUNITY): Payer: Self-pay | Admitting: Gastroenterology

## 2020-08-03 ENCOUNTER — Encounter (HOSPITAL_COMMUNITY)
Admission: RE | Disposition: A | Discharge status: Home / Self Care | Payer: Self-pay | Source: Home / Self Care | Attending: Gastroenterology

## 2020-08-03 DIAGNOSIS — L89899 Pressure ulcer of other site, unspecified stage: Secondary | ICD-10-CM | POA: Diagnosis not present

## 2020-08-03 DIAGNOSIS — K5730 Diverticulosis of large intestine without perforation or abscess without bleeding: Secondary | ICD-10-CM | POA: Diagnosis not present

## 2020-08-03 DIAGNOSIS — G8220 Paraplegia, unspecified: Secondary | ICD-10-CM | POA: Insufficient documentation

## 2020-08-03 DIAGNOSIS — K648 Other hemorrhoids: Secondary | ICD-10-CM | POA: Diagnosis not present

## 2020-08-03 DIAGNOSIS — Z79899 Other long term (current) drug therapy: Secondary | ICD-10-CM | POA: Insufficient documentation

## 2020-08-03 DIAGNOSIS — Z87891 Personal history of nicotine dependence: Secondary | ICD-10-CM | POA: Insufficient documentation

## 2020-08-03 DIAGNOSIS — R195 Other fecal abnormalities: Secondary | ICD-10-CM

## 2020-08-03 HISTORY — PX: COLONOSCOPY: SHX5424

## 2020-08-03 SURGERY — COLONOSCOPY
Anesthesia: Monitor Anesthesia Care

## 2020-08-03 MED ORDER — PROPOFOL 1000 MG/100ML IV EMUL
INTRAVENOUS | Status: AC
  Filled 2020-08-03: qty 100

## 2020-08-03 MED ORDER — LACTATED RINGERS IV SOLN
INTRAVENOUS | Status: AC | PRN
  Administered 2020-08-03: 1000 mL via INTRAVENOUS

## 2020-08-03 MED ORDER — SODIUM CHLORIDE 0.9 % IV SOLN: INTRAVENOUS | Status: DC

## 2020-08-03 NOTE — Op Note (Signed)
[facility] Patient Name: Jon Wells Procedure Date: 08/03/2020 MRN: [id number] Attending MD: Jackquline Denmark , MD Date of Birth: [birth year] CSN: [id number] Age: 64 Admit Type: Outpatient Procedure:                Colonoscopy Indications:              Positive Cologuard test Providers:                Jackquline Denmark, MD, Clyde Lundborg, RN, Fransico Setters                            Mbumina, Technician Referring MD:              Medicines:                None Complications:            No immediate complications. Estimated Blood Loss:     Estimated blood loss: none. Procedure:                Pre-Anesthesia Assessment:                           - Prior to the procedure, a History and Physical                            was performed, and patient medications and                            allergies were reviewed. The patient's tolerance of                            previous anesthesia was also reviewed. The risks                            and benefits of the procedure and the sedation                            options and risks were discussed with the patient.                            All questions were answered, and informed consent                            was obtained. Prior Anticoagulants: The patient has                            taken no previous anticoagulant or antiplatelet                            agents. ASA Grade Assessment: III - A patient with                            severe systemic disease. After reviewing the risks                            and benefits, the patient  was deemed in                            satisfactory condition to undergo the procedure.                           After obtaining informed consent, the colonoscope                            was passed under direct vision. Throughout the                            procedure, the patient's blood pressure, pulse, and                            oxygen saturations were monitored continuously. The                             CF-HQ190L ([id number]) Olympus colonoscope was                            introduced through the anus and advanced to the the                            cecum, identified by appendiceal orifice and                            ileocecal valve. The colonoscopy was performed                            without difficulty. The patient tolerated the                            procedure well. The quality of the bowel                            preparation was good. The ileocecal valve,                            appendiceal orifice, and rectum were photographed. Scope In: 9:42:17 AM Scope Out: 10:01:15 AM Scope Withdrawal Time: 0 hours 11 minutes 20 seconds  Total Procedure Duration: 0 hours 18 minutes 58 seconds  Findings:      The perianal exam findings include 2 decubitus ulcers (sacral and left       perianal area) with clean bases.      A few small-mouthed diverticula were found in the sigmoid colon and       ascending colon.      Non-bleeding internal hemorrhoids were found during retroflexion and       during perianal exam. The hemorrhoids were small.      The exam was otherwise without abnormality on direct and retroflexion       views. Impression:               - Decubitus ulcers found on perianal exam (under  care of wound clinic).                           - Mild pancolonic diverticulosis.                           - Non-bleeding internal hemorrhoids.                           - The examination was otherwise normal on direct                            and retroflexion views.                           - No specimens collected. Moderate Sedation:      none Recommendation:           - Patient has a contact number available for                            emergencies. The signs and symptoms of potential                            delayed complications were discussed with the                            patient. Return to normal  activities tomorrow.                            Written discharge instructions were provided to the                            patient.                           - Resume previous diet.                           - Continue present medications and recommendations                            of wound clinic. Keep it dry.                           - Repeat colonoscopy in 10 years for screening                            purposes. Earlier, if any new problems or change in                            [name].                           - Return to GI clinic PRN.                           -  The findings and recommendations were discussed                            with the patient. Procedure Code(s):        --- Professional ---                           [phone], Colonoscopy, flexible; diagnostic, including                            collection of specimen(s) by brushing or washing,                            when performed (separate procedure) Diagnosis Code(s):        --- Professional ---                           E09.233, Pressure ulcer of other site, unspecified                            stage                           K64.8, Other hemorrhoids                           R19.5, Other fecal abnormalities                           K57.30, Diverticulosis of large intestine without                            perforation or abscess without bleeding CPT copyright 2019 American Medical Association. All rights reserved. The codes documented in this report are preliminary and upon coder review may  be revised to meet current compliance requirements. Jackquline Denmark, MD 08/03/2020 10:10:23 AM This report has been signed electronically. Number of Addenda: 0

## 2020-08-03 NOTE — Progress Notes (Signed)
S/p colonoscopy without sedation. BP in recovery room 180-190's/100's. Pre op BP 182/90. EKG rhythm SR w/PVC's. Dr Lyndel Safe aware, ok'd for DC home. Pt to take full dose BP medicine when gets home.
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
## 2020-08-03 NOTE — Discharge Instructions (Signed)

## 2020-08-05 ENCOUNTER — Encounter (HOSPITAL_COMMUNITY): Payer: Self-pay | Admitting: Gastroenterology

## 2020-09-23 DIAGNOSIS — R399 Unspecified symptoms and signs involving the genitourinary system: Secondary | ICD-10-CM | POA: Diagnosis not present

## 2020-09-23 DIAGNOSIS — N319 Neuromuscular dysfunction of bladder, unspecified: Secondary | ICD-10-CM | POA: Diagnosis not present

## 2020-09-23 DIAGNOSIS — N528 Other male erectile dysfunction: Secondary | ICD-10-CM | POA: Diagnosis not present

## 2020-10-27 DIAGNOSIS — M542 Cervicalgia: Secondary | ICD-10-CM | POA: Diagnosis not present

## 2020-10-27 DIAGNOSIS — M25512 Pain in left shoulder: Secondary | ICD-10-CM | POA: Diagnosis not present

## 2020-11-12 ENCOUNTER — Other Ambulatory Visit: Payer: Self-pay | Admitting: Orthopedic Surgery

## 2020-11-12 DIAGNOSIS — M25512 Pain in left shoulder: Secondary | ICD-10-CM

## 2020-11-12 DIAGNOSIS — Z23 Encounter for immunization: Secondary | ICD-10-CM | POA: Diagnosis not present

## 2020-12-01 ENCOUNTER — Ambulatory Visit
Admission: RE | Admit: 2020-12-01 | Discharge: 2020-12-01 | Disposition: A | Discharge status: Home / Self Care | Payer: Medicare Other | Source: Ambulatory Visit | Attending: Orthopedic Surgery | Admitting: Orthopedic Surgery

## 2020-12-01 ENCOUNTER — Other Ambulatory Visit: Payer: Self-pay

## 2020-12-01 DIAGNOSIS — M19012 Primary osteoarthritis, left shoulder: Secondary | ICD-10-CM | POA: Diagnosis not present

## 2020-12-01 DIAGNOSIS — M25512 Pain in left shoulder: Secondary | ICD-10-CM

## 2020-12-03 DIAGNOSIS — M25512 Pain in left shoulder: Secondary | ICD-10-CM | POA: Diagnosis not present

## 2020-12-22 DIAGNOSIS — M6281 Muscle weakness (generalized): Secondary | ICD-10-CM | POA: Diagnosis not present

## 2020-12-22 DIAGNOSIS — M25511 Pain in right shoulder: Secondary | ICD-10-CM | POA: Diagnosis not present

## 2020-12-22 DIAGNOSIS — M25512 Pain in left shoulder: Secondary | ICD-10-CM | POA: Diagnosis not present

## 2020-12-28 DIAGNOSIS — G8220 Paraplegia, unspecified: Secondary | ICD-10-CM | POA: Diagnosis not present

## 2020-12-28 DIAGNOSIS — N3281 Overactive bladder: Secondary | ICD-10-CM | POA: Diagnosis not present

## 2020-12-28 DIAGNOSIS — Z0000 Encounter for general adult medical examination without abnormal findings: Secondary | ICD-10-CM | POA: Diagnosis not present

## 2020-12-28 DIAGNOSIS — E782 Mixed hyperlipidemia: Secondary | ICD-10-CM | POA: Diagnosis not present

## 2020-12-28 DIAGNOSIS — Z125 Encounter for screening for malignant neoplasm of prostate: Secondary | ICD-10-CM | POA: Diagnosis not present

## 2020-12-28 DIAGNOSIS — Z79899 Other long term (current) drug therapy: Secondary | ICD-10-CM | POA: Diagnosis not present

## 2020-12-28 DIAGNOSIS — N390 Urinary tract infection, site not specified: Secondary | ICD-10-CM | POA: Diagnosis not present

## 2021-03-11 DIAGNOSIS — G8220 Paraplegia, unspecified: Secondary | ICD-10-CM | POA: Diagnosis not present

## 2021-03-11 DIAGNOSIS — G546 Phantom limb syndrome with pain: Secondary | ICD-10-CM | POA: Diagnosis not present

## 2021-03-11 DIAGNOSIS — I10 Essential (primary) hypertension: Secondary | ICD-10-CM | POA: Diagnosis not present

## 2021-05-27 DIAGNOSIS — N319 Neuromuscular dysfunction of bladder, unspecified: Secondary | ICD-10-CM | POA: Diagnosis not present

## 2021-06-02 DIAGNOSIS — Z01810 Encounter for preprocedural cardiovascular examination: Secondary | ICD-10-CM | POA: Diagnosis not present

## 2021-06-02 DIAGNOSIS — N528 Other male erectile dysfunction: Secondary | ICD-10-CM | POA: Diagnosis not present

## 2021-06-02 DIAGNOSIS — Z01818 Encounter for other preprocedural examination: Secondary | ICD-10-CM | POA: Diagnosis not present

## 2021-06-03 DIAGNOSIS — Z993 Dependence on wheelchair: Secondary | ICD-10-CM | POA: Diagnosis not present

## 2021-06-03 DIAGNOSIS — N319 Neuromuscular dysfunction of bladder, unspecified: Secondary | ICD-10-CM | POA: Diagnosis not present

## 2021-06-03 DIAGNOSIS — N528 Other male erectile dysfunction: Secondary | ICD-10-CM | POA: Diagnosis not present

## 2021-06-03 DIAGNOSIS — T83410A Breakdown (mechanical) of penile (implanted) prosthesis, initial encounter: Secondary | ICD-10-CM | POA: Diagnosis not present

## 2021-06-03 DIAGNOSIS — G8220 Paraplegia, unspecified: Secondary | ICD-10-CM | POA: Diagnosis not present

## 2021-06-03 DIAGNOSIS — Z79899 Other long term (current) drug therapy: Secondary | ICD-10-CM | POA: Diagnosis not present

## 2021-06-03 DIAGNOSIS — T83490A Other mechanical complication of penile (implanted) prosthesis, initial encounter: Secondary | ICD-10-CM | POA: Diagnosis not present

## 2021-06-04 DIAGNOSIS — N319 Neuromuscular dysfunction of bladder, unspecified: Secondary | ICD-10-CM | POA: Diagnosis not present

## 2021-06-04 DIAGNOSIS — Z79899 Other long term (current) drug therapy: Secondary | ICD-10-CM | POA: Diagnosis not present

## 2021-06-04 DIAGNOSIS — G8220 Paraplegia, unspecified: Secondary | ICD-10-CM | POA: Diagnosis not present

## 2021-06-04 DIAGNOSIS — N528 Other male erectile dysfunction: Secondary | ICD-10-CM | POA: Diagnosis not present

## 2021-06-04 DIAGNOSIS — Z993 Dependence on wheelchair: Secondary | ICD-10-CM | POA: Diagnosis not present

## 2021-06-09 ENCOUNTER — Other Ambulatory Visit: Payer: Self-pay | Admitting: Gastroenterology

## 2021-06-09 DIAGNOSIS — R1084 Generalized abdominal pain: Secondary | ICD-10-CM

## 2021-07-22 DIAGNOSIS — N319 Neuromuscular dysfunction of bladder, unspecified: Secondary | ICD-10-CM | POA: Diagnosis not present

## 2021-08-11 DIAGNOSIS — L89212 Pressure ulcer of right hip, stage 2: Secondary | ICD-10-CM | POA: Diagnosis not present

## 2021-08-11 DIAGNOSIS — G894 Chronic pain syndrome: Secondary | ICD-10-CM | POA: Diagnosis not present

## 2021-08-11 DIAGNOSIS — Z23 Encounter for immunization: Secondary | ICD-10-CM | POA: Diagnosis not present

## 2021-08-17 ENCOUNTER — Ambulatory Visit: Payer: Medicare Other | Admitting: Physician Assistant

## 2021-08-26 DIAGNOSIS — N3001 Acute cystitis with hematuria: Secondary | ICD-10-CM | POA: Diagnosis not present

## 2022-04-01 IMAGING — MR MR SHOULDER*L* W/O CM
4 of 5 series · 23 of 40 positions shown · non-contrast
Comparison: None.

CLINICAL DATA: Left shoulder pain for 1 year.  No known injury.

EXAM:
MRI OF THE LEFT SHOULDER WITHOUT CONTRAST
TECHNIQUE: Multiplanar, multisequence MR imaging of the shoulder was performed.
No intravenous contrast was administered.

[Series 6: T2 fat-sat · axial · left · 3.0mm · 0.50mm/px · z∈[-29,+72]mm · 8 of 27 slices shown (1 of 3)]
[im 1/27]
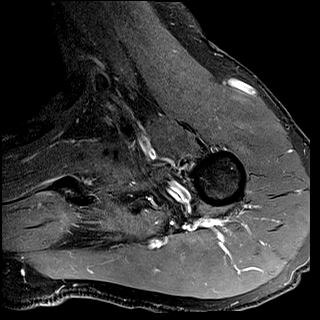
[im 3/27]
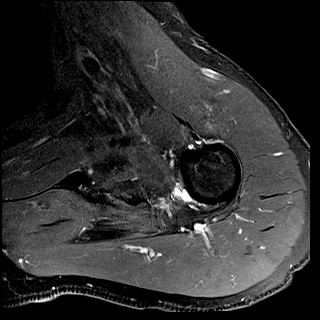
[im 9/27]
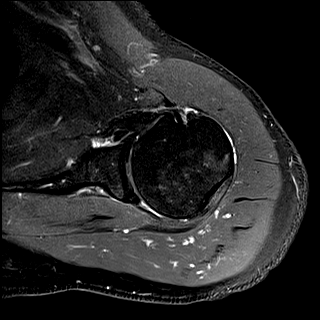
[im 12/27]
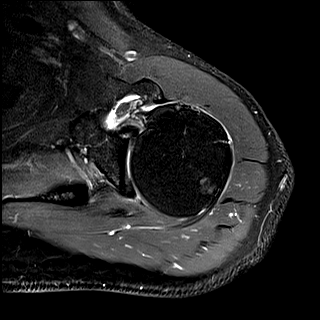
[im 15/27]
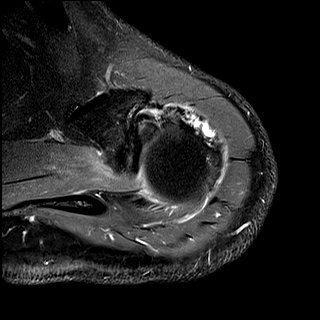
[im 18/27]
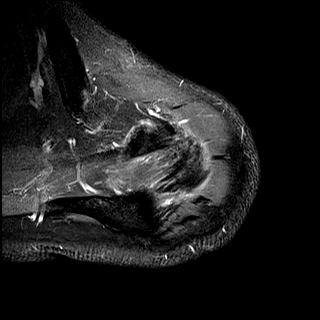
[im 24/27]
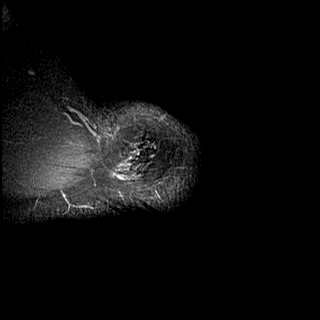
[im 27/27]
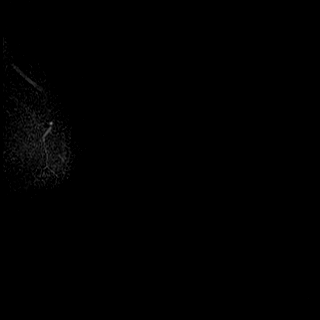

[Series 7: T2 fat-sat · coronal · left · 4.0mm · 0.25mm/px · 5 of 23 slices shown (2 of 3)]
[im 1/23]
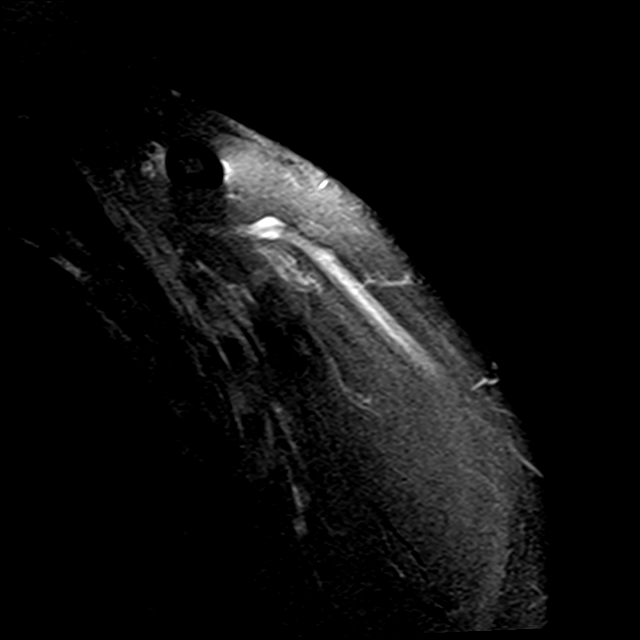
[im 4/23]
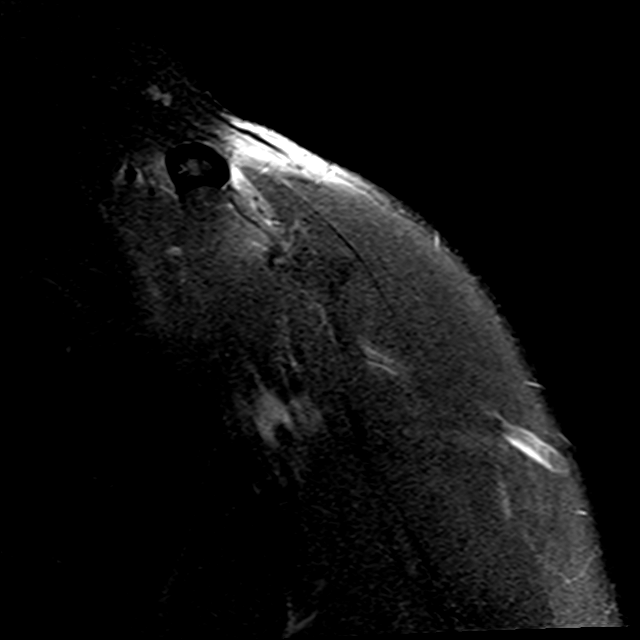
[im 8/23]
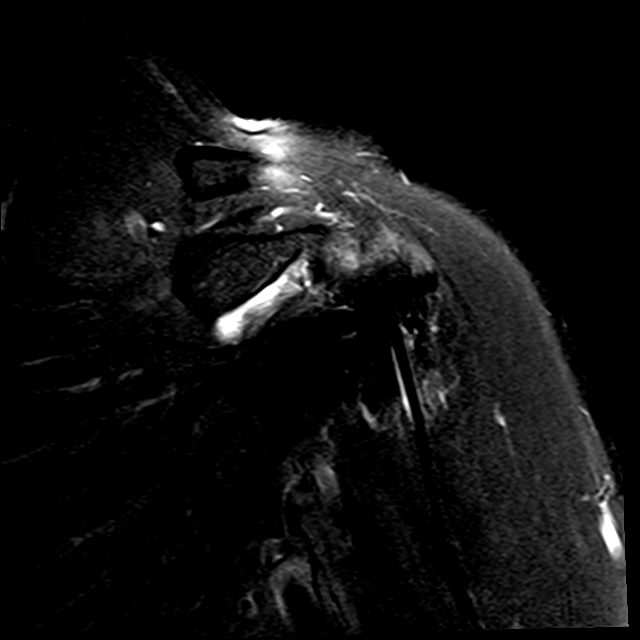
[im 12/23]
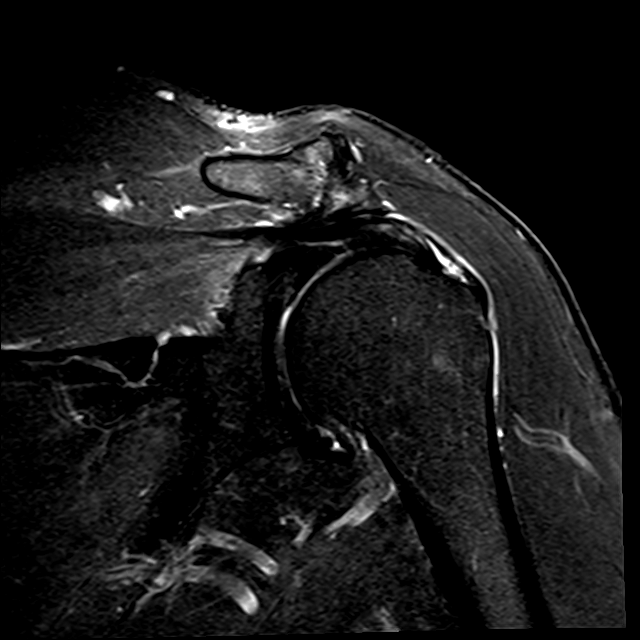
[im 19/23]
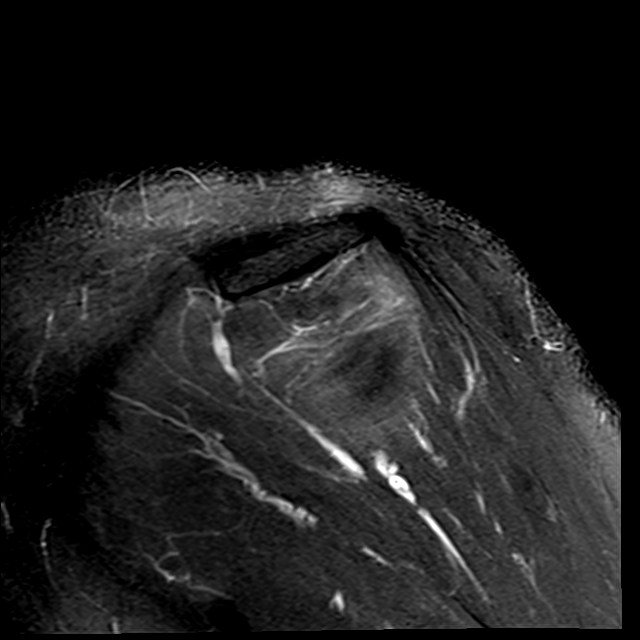

[Series 8: PD · coronal · left · 4.0mm · 0.31mm/px · 7 of 23 slices shown]
[im 1/23]
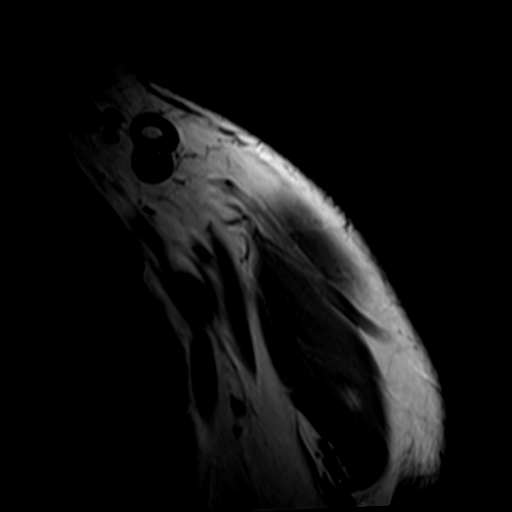
[im 4/23]
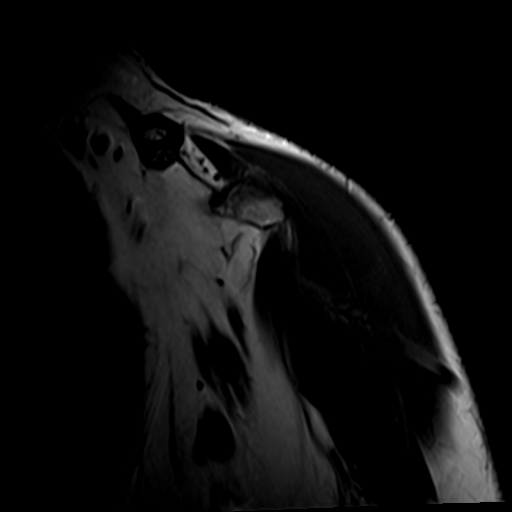
[im 8/23]
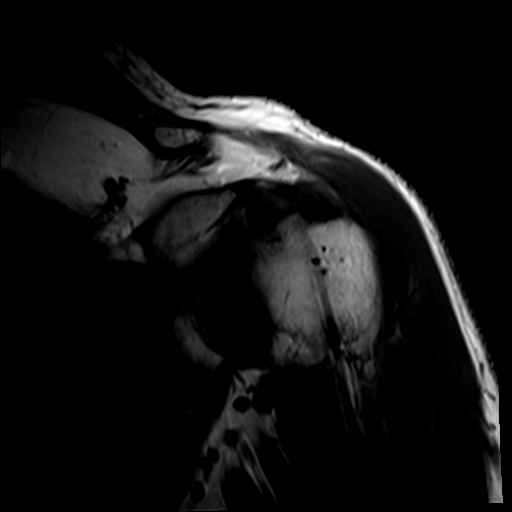
[im 12/23]
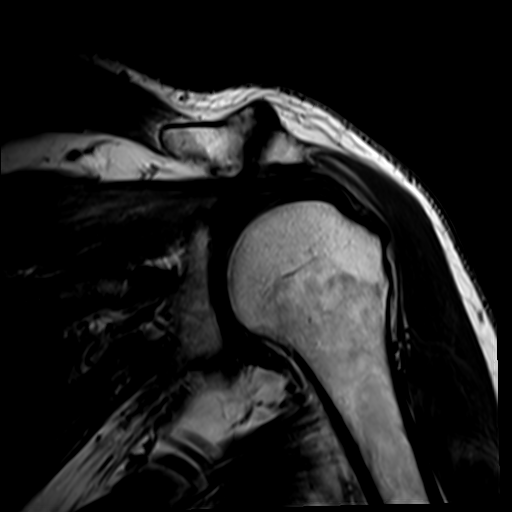
[im 15/23]
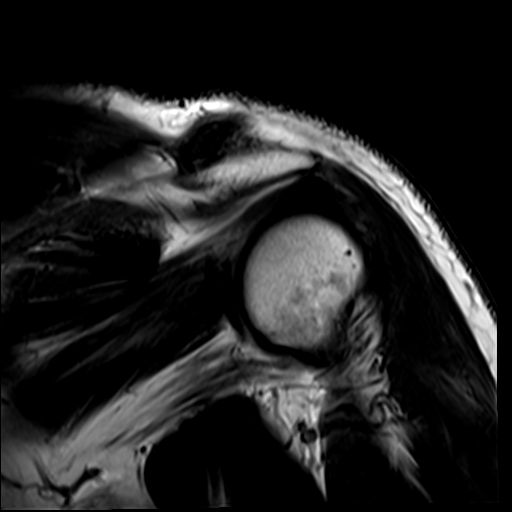
[im 19/23]
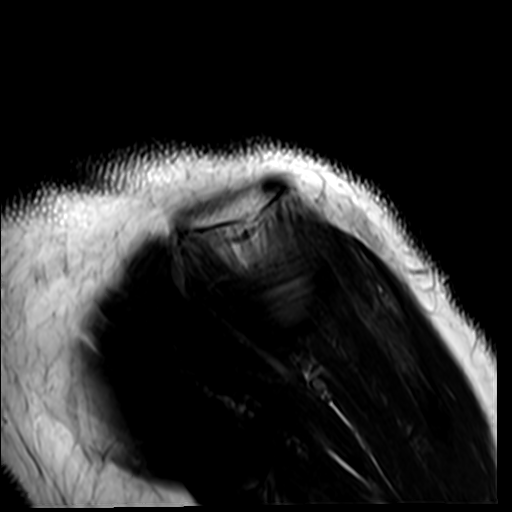
[im 23/23]
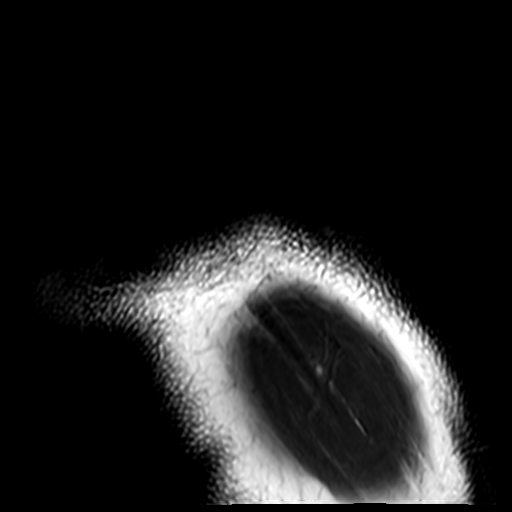

[Series 9: T2 fat-sat · sagittal · left · 4.0mm · 0.59mm/px · 3 of 25 slices shown (3 of 3)]
[im 4/25]
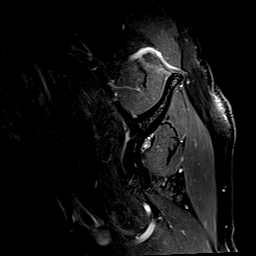
[im 14/25]
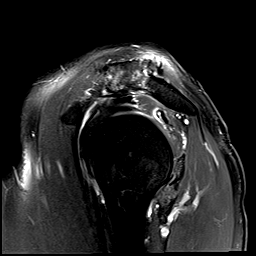
[im 21/25]
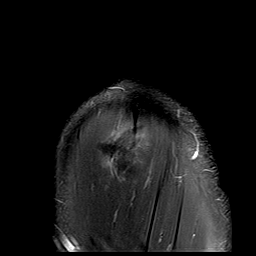

[23 of 40 positions shown; findings below may reference images not displayed]

FINDINGS: Rotator cuff: The patient has rotator cuff tendinosis which is worst
in the supraspinatus. There is a bursal sided tear of the anterior
and far lateral supraspinatus measuring approximately 0.7 cm from
front to back. Mild laminar retraction of approximately 1 cm is
seen. The rotator cuff is otherwise intact.

Muscles: There is fatty atrophy of the teres minor. Musculature is
otherwise preserved.

Biceps long head:  Intact.

Acromioclavicular Joint: Bulky osteoarthritis. Type 1 acromion.
There is a small volume of fluid in the subacromial/subdeltoid
bursa.

Glenohumeral Joint: Unremarkable.

Labrum:  Intact.

Bones:  No fracture or worrisome lesion.

Other: None.
IMPRESSION: Rotator cuff tendinopathy with a 0.7 cm from front to back bursal
sided tear of the anterior and far lateral supraspinatus. Mild
laminar retraction of up to 1 cm. No supraspinatus atrophy.

Bulky acromioclavicular osteoarthritis.

Fatty replacement of the teres minor compatible with quadrilateral
space syndrome. No mass or fluid collection is seen in the
quadrilateral space. The finding may be due to fibrous bands.

Small volume of subacromial/subdeltoid fluid compatible with
bursitis.

## 2022-05-06 ENCOUNTER — Emergency Department (HOSPITAL_COMMUNITY)
Admission: EM | Admit: 2022-05-06 | Discharge: 2022-05-06 | Discharge status: Against Medical Advice | Payer: Medicare HMO | Attending: Emergency Medicine | Admitting: Emergency Medicine

## 2022-05-06 DIAGNOSIS — S31809A Unspecified open wound of unspecified buttock, initial encounter: Secondary | ICD-10-CM | POA: Diagnosis present

## 2022-05-06 DIAGNOSIS — Z5321 Procedure and treatment not carried out due to patient leaving prior to being seen by health care provider: Secondary | ICD-10-CM | POA: Insufficient documentation

## 2022-05-06 DIAGNOSIS — X58XXXA Exposure to other specified factors, initial encounter: Secondary | ICD-10-CM | POA: Diagnosis not present
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
## 2022-05-24 ENCOUNTER — Encounter (HOSPITAL_BASED_OUTPATIENT_CLINIC_OR_DEPARTMENT_OTHER): Payer: 59 | Admitting: General Surgery

## 2022-05-25 ENCOUNTER — Ambulatory Visit (HOSPITAL_BASED_OUTPATIENT_CLINIC_OR_DEPARTMENT_OTHER): Payer: 59 | Admitting: General Surgery

## 2022-06-30 ENCOUNTER — Encounter (HOSPITAL_BASED_OUTPATIENT_CLINIC_OR_DEPARTMENT_OTHER): Payer: Medicare HMO | Attending: Internal Medicine | Admitting: Internal Medicine

## 2022-06-30 DIAGNOSIS — L89153 Pressure ulcer of sacral region, stage 3: Secondary | ICD-10-CM | POA: Insufficient documentation

## 2022-06-30 DIAGNOSIS — N319 Neuromuscular dysfunction of bladder, unspecified: Secondary | ICD-10-CM | POA: Diagnosis not present

## 2022-06-30 DIAGNOSIS — I10 Essential (primary) hypertension: Secondary | ICD-10-CM | POA: Insufficient documentation

## 2022-06-30 DIAGNOSIS — G8220 Paraplegia, unspecified: Secondary | ICD-10-CM | POA: Diagnosis not present

## 2022-06-30 NOTE — Progress Notes (Signed)
HERSCHEL, FLEAGLE ([id number]) Visit Report for 06/30/2022 Chief Complaint Document Details Patient Name: Date of Service: Dione Housekeeper [name] 06/30/2022 1:00 PM Medical Record Number: [id number] Patient Account Number: [id number] Date of Birth/Sex: Treating RN: 1956-02-12 (66 y.o. [name], [name] Primary Care Provider: [name] Other Clinician: Referring Provider: Treating Provider/Extender: Benita Gutter RT, JENNIFER Weeks in Treatment: 0 Information Obtained from: Patient Chief Complaint 01/09/18; patient is here for a second opinion with regards to a pressure area on the lower sacrum/coccyx 06/30/2022; patient returns to clinic for again a pressure ulcer on the lower sacrum/coccyx Electronic Signature(s) Signed: 06/30/2022 4:35:07 PM By: Linton Ham MD Entered By: Linton Ham on 06/30/2022 14:13:23 -------------------------------------------------------------------------------- Debridement Details Patient Name: Date of Service: Dione Housekeeper [name] 06/30/2022 1:00 PM Medical Record Number: [id number] Patient Account Number: [id number] Date of Birth/Sex: Treating RN: 1956-07-05 (66 y.o. [name], [name] Primary Care Provider: [name] Other Clinician: Referring Provider: Treating Provider/Extender: Benita Gutter RT, JENNIFER Weeks in Treatment: 0 Debridement Performed for Assessment: Wound #2 Sacrum Performed By: Physician Ricard Dillon., MD Debridement Type: Debridement Level of Consciousness (Pre-procedure): Awake and Alert Pre-procedure Verification/Time Out Yes - 13:50 Taken: Start Time: 13:50 T Area Debrided (L x W): otal 5.5 (cm) x 2.5 (cm) = 13.75 (cm) Tissue and other material debrided: Viable, Non-Viable, Slough, Subcutaneous, Skin: Dermis , Skin: Epidermis, Slough Level: Skin/Subcutaneous Tissue Debridement Description: Excisional Instrument: Curette Bleeding: Minimum Hemostasis Achieved: Pressure End Time:  13:50 Procedural Pain: 0 Post Procedural Pain: 0 Response to Treatment: Procedure was tolerated well Level of Consciousness (Post- Awake and Alert procedure): Post Debridement Measurements of Total Wound Length: (cm) 5.5 Stage: Category/Stage III Width: (cm) 2.5 Depth: (cm) 0.3 Volume: (cm) 3.24 Character of Wound/Ulcer Post Debridement: Improved Post Procedure Diagnosis Same as Pre-procedure Electronic Signature(s) Signed: 06/30/2022 4:08:15 PM By: Rhae Hammock RN Signed: 06/30/2022 4:35:07 PM By: Linton Ham MD Entered By: Linton Ham on 06/30/2022 14:12:59 -------------------------------------------------------------------------------- HPI Details Patient Name: Date of Service: Dione Housekeeper [name] 06/30/2022 1:00 PM Medical Record Number: [id number] Patient Account Number: [id number] Date of Birth/Sex: Treating RN: [birth year] (66 y.o. 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 He is an active man has a wheelchair with a Roho cushion. Still active around the home. He is been battling an area on the lower sacrum/coccyx for about a year and according to him is been at the [facility] wound care center for 6-7 months. He didn't want [place] to call for records as he was largely here just for a second opinion and therefore I'm not completely certain what is been used to treat this wound. He is currently using calcium alginate. Has used Xeroform and apparently Prisma at least he appeared to recognize Prisma. He doesn't really have much in the way of additional medical issues. He is hypertensive been only medication is lisinopril. He is not a diabetic. He does digital stimulation  for bowel movements and in and out cath for voiding. 01/23/18; the patient arrives having cut a hole out of an old cushion for his wheelchair. This offloaded the area in question. He is also been using Endoform to the actual wounds. He arrives today with the wounds less  than half the size of last time. Really dramatic surprising improvement All capital readmission 06/30/2022 This is a 66 year old man who is now greater than 20 years post traumatic T12 lesion with resultant paralysis. We had them for 2 visits in 2019 for a wound in the same area I do not think he was discharged in a healed state. He tells me that the wound has varied in condition from time to time over the years most of the time and will gradually improve on its own however over the last 3 months or so he has developed the largest wound he has had and it just will not progress towards healing. He has been going to the wound care center in [place] and of course I have none of these notes. They apparently have had trouble getting a dressing to stay on in the area. In his wheelchair he has a Roho cushion with the coccyx area cut out. He tells me he is rigorous about offloading this in bed. Recently they cultured staph there and he has just completed antibiotics although were not sure which ones.He tells me that they could not get any dressing to adhere to the area. He is also concerned about continuous drainage Past medical history hypertension, T12 level paralysis. He has a neurogenic bladder and does in and out caths. His bowel movements via digital stimulation Electronic Signature(s) Signed: 06/30/2022 4:35:07 PM By: Linton Ham MD Entered By: Linton Ham on 06/30/2022 14:15:57 -------------------------------------------------------------------------------- Physical Exam Details Patient Name: Date of Service: Dione Housekeeper [name] 06/30/2022 1:00 PM Medical Record Number: [id number] Patient Account Number: [id number] Date of  Birth/Sex: Treating RN: Oct 13, 1956 (66 y.o. [name] Primary Care Provider: [name] Other Clinician: Referring Provider: Treating Provider/Extender: Benita Gutter RT, JENNIFER Weeks in Treatment: 0 Constitutional Patient is hypertensive.. Pulse regular and within target range for patient.Marland Kitchen Respirations regular, non-labored and within target range.. Temperature is normal and within the target range for the patient.Marland Kitchen Appears in no distress. Notes Wound exam; on the lower part of his coccyx abutting on the perineum. Fairly large wound rolled senescent edges around the wound circumference and a very gritty surface. No evidence of surrounding infection there is no erythema no crepitus no odor Electronic Signature(s) Signed: 06/30/2022 4:35:07 PM By: Linton Ham MD Entered By: Linton Ham on 06/30/2022 14:16:56 -------------------------------------------------------------------------------- Physician Orders Details Patient Name: Date of Service: [name] [name] 06/30/2022 1:00 PM Medical Record Number: [id number] Patient Account Number: [id number] Date of Birth/Sex: Treating RN: Mar 03, 1956 (66 y.o. [name] Primary Care Provider: [name] Other Clinician: Referring Provider: Treating Provider/Extender: Benita Gutter RT, JENNIFER Weeks in Treatment: 0 Verbal / Phone Orders: No Diagnosis Coding Follow-up Appointments ppointment in 1 week. - Thurs. w/ Dr. Heber Halfway and Elias Else # 9 Return A Bathing/ Shower/ Hygiene May shower with protection but do not get wound dressing(s) wet. Off-Loading Turn and reposition every 2 hours Wound Treatment Wound #2 - Sacrum Cleanser: Soap and Water 1 x Per Day/15 Days Discharge Instructions: May shower and wash wound with dial antibacterial soap and water prior to dressing change. Cleanser: Wound Cleanser (DME) (Generic) 1 x Per Day/15 Days Discharge Instructions: Cleanse the wound with  wound cleanser prior to applying a clean dressing using gauze sponges, not tissue or cotton balls. Peri-Wound Care: Skin Prep (DME) (Generic) 1 x Per Day/15 Days Discharge Instructions: Use skin prep as directed Prim Dressing: KerraCel Ag Gelling Fiber Dressing, 4x5 in (silver alginate) (DME) (  Generic) 1 x Per Day/15 Days ary Discharge Instructions: Apply silver alginate to wound bed as instructed Secondary Dressing: Zetuvit Plus Silicone Border Dressing 5x5 (in/in) (DME) (Generic) 1 x Per Day/15 Days Discharge Instructions: Apply silicone border over primary dressing as directed. Secured With: 47M Medipore H Soft Cloth Surgical T ape, 4 x 10 (in/yd) (DME) (Generic) 1 x Per Day/15 Days Discharge Instructions: Secure with tape as directed. [employer] PCR culture - Sacral wound Electronic Signature(s) Signed: 06/30/2022 4:08:15 PM By: Rhae Hammock RN Signed: 06/30/2022 4:35:07 PM By: Linton Ham MD Entered By: Rhae Hammock on 06/30/2022 14:06:03 -------------------------------------------------------------------------------- Problem List Details Patient Name: Date of Service: Dione Housekeeper [name] 06/30/2022 1:00 PM Medical Record Number: [id number] Patient Account Number: [id number] Date of Birth/Sex: Treating RN: Mar 05, 1956 (66 y.o. [name], [name] Primary Care Provider: [name] Other Clinician: Referring Provider: Treating Provider/Extender: Benita Gutter RT, JENNIFER Weeks in Treatment: 0 Active Problems ICD-10 Encounter Code Description Active Date MDM Diagnosis L89.153 Pressure ulcer of sacral region, stage 3 06/30/2022 No Yes S24.114D Complete lesion at T11-T12 level of thoracic spinal cord, subsequent encounter 06/30/2022 No Yes Inactive Problems Resolved Problems Electronic Signature(s) Signed: 06/30/2022 4:35:07 PM By: Linton Ham MD Entered By: Linton Ham on 06/30/2022  14:01:14 -------------------------------------------------------------------------------- Progress Note Details Patient Name: Date of Service: Dione Housekeeper [name] 06/30/2022 1:00 PM Medical Record Number: [id number] Patient Account Number: [id number] Date of Birth/Sex: Treating RN: January 07, 1956 (66 y.o. 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 He is an active man has a wheelchair with a Roho cushion. Still active around the home. He is been battling an area on the lower sacrum/coccyx for about a year and according to him is been at the [facility] wound care center for 6-7 months. He didn't want [place] to call for records as he was largely here just for a second opinion and therefore I'm not completely certain what is been used to treat this wound. He is currently using calcium alginate. Has used Xeroform and apparently Prisma at least he appeared to recognize Prisma. He doesn't really have much in the way of additional medical issues. He is hypertensive been only medication is lisinopril. He is not a diabetic. He does digital stimulation for bowel movements and in and out cath for voiding. 01/23/18; the patient arrives having cut a hole out of an old cushion for his wheelchair. This offloaded the area in question. He is also been using Endoform to the actual wounds. He arrives today with the wounds less than half the size of last time. Really dramatic  surprising improvement All capital readmission 06/30/2022 This is a 66 year old man who is now greater than 20 years post traumatic T12 lesion with resultant paralysis. We had them for 2 visits in 2019 for a wound in the same area I do not think he was discharged in a healed state. He tells me that the wound has varied in condition from time to time over the years most of the  time and will gradually improve on its own however over the last 3 months or so he has developed the largest wound he has had and it just will not progress towards healing. He has been going to the wound care center in [place] and of course I have none of these notes. They apparently have had trouble getting a dressing to stay on in the area. In his wheelchair he has a Roho cushion with the coccyx area cut out. He tells me he is rigorous about offloading this in bed. Recently they cultured staph there and he has just completed antibiotics although were not sure which ones.He tells me that they could not get any dressing to adhere to the area. He is also concerned about continuous drainage Past medical history hypertension, T12 level paralysis. He has a neurogenic bladder and does in and out caths. His bowel movements via digital stimulation Patient History Information obtained from Patient. Allergies Sulfa (Sulfonamide Antibiotics) (Severity: Moderate, Reaction: rash) Family History Diabetes - Mother, Hypertension - Mother,Father, Kidney Disease - Mother, No family history of Cancer, Heart Disease, Hereditary Spherocytosis, Lung Disease, Seizures, Stroke, Thyroid Problems, Tuberculosis. Social History Never smoker, Marital Status - Married, Alcohol Use - Rarely, Drug Use - No History, Caffeine Use - Daily - coffee. Medical History Eyes Denies history of Cataracts, Glaucoma, Optic Neuritis Ear/Nose/Mouth/Throat Denies history of Chronic sinus problems/congestion, Middle ear problems Hematologic/Lymphatic Denies history  of Anemia, Hemophilia, Human Immunodeficiency Virus, Lymphedema, Sickle Cell Disease Respiratory Denies history of Aspiration, Asthma, Chronic Obstructive Pulmonary Disease (COPD), Pneumothorax, Sleep Apnea, Tuberculosis Cardiovascular Patient has history of Hypertension Denies history of Angina, Arrhythmia, Congestive Heart Failure, Coronary Artery Disease, Deep Vein Thrombosis, Hypotension, Myocardial Infarction, Peripheral Arterial Disease, Peripheral Venous Disease, Phlebitis, Vasculitis Gastrointestinal Denies history of Cirrhosis , Colitis, Crohnoos, Hepatitis A, Hepatitis B, Hepatitis C Endocrine Denies history of Type I Diabetes, Type II Diabetes Genitourinary Denies history of End Stage Renal Disease Immunological Denies history of Lupus Erythematosus, Raynaudoos, Scleroderma Integumentary (Skin) Denies history of History of Burn Musculoskeletal Denies history of Gout, Rheumatoid Arthritis, Osteoarthritis, Osteomyelitis Neurologic Patient has history of Paraplegia - T12 Denies history of Dementia, Neuropathy, Quadriplegia, Seizure Disorder Oncologic Denies history of Received Chemotherapy, Received Radiation Psychiatric Denies history of Anorexia/bulimia, Confinement Anxiety Hospitalization/Surgery History - back surgery. Medical A Surgical History Notes nd Genitourinary self cath Review of Systems (ROS) Integumentary (Skin) Complains or has symptoms of Wounds. Objective Constitutional Patient is hypertensive.. Pulse regular and within target range for patient.Marland Kitchen Respirations regular, non-labored and within target range.. Temperature is normal and within the target range for the patient.Marland Kitchen Appears in no distress. Vitals Time Taken: 1:15 PM, Temperature: 98.4 F, Pulse: 69 bpm, Respiratory Rate: 17 breaths/min, Blood Pressure: 154/77 mmHg. General Notes: Wound exam; on the lower part of his coccyx abutting on the perineum. Fairly large wound rolled senescent edges  around the wound circumference and a very gritty surface. No evidence of surrounding infection there is no erythema no crepitus no odor Integumentary (Hair, Skin) Wound #2 status is Open. Original cause of wound was Pressure Injury. The date acquired was: 03/17/2022. The wound is located on the Sacrum. The wound measures 5.5cm length x 2.5cm width x 0.3cm depth; 10.799cm^2 area and 3.24cm^3 volume. There is Fat Layer (Subcutaneous Tissue) exposed. There is no tunneling or undermining noted. There is a medium amount of serosanguineous drainage noted. The wound margin is distinct with the outline attached to the wound base. There is large (67-100%) red, pink granulation within the wound  bed. There is a small (1-33%) amount of necrotic tissue within the wound bed including Adherent Slough. Assessment Active Problems ICD-10 Pressure ulcer of sacral region, stage 3 Complete lesion at T11-T12 level of thoracic spinal cord, subsequent encounter Procedures Wound #2 Pre-procedure diagnosis of Wound #2 is a Pressure Ulcer located on the Sacrum . There was a Excisional Skin/Subcutaneous Tissue Debridement with a total area of 13.75 sq cm performed by Ricard Dillon., MD. With the following instrument(s): Curette to remove Viable and Non-Viable tissue/material. Material removed includes Subcutaneous Tissue, Slough, Skin: Dermis, and Skin: Epidermis. No specimens were taken. A time out was conducted at 13:50, prior to the start of the procedure. A Minimum amount of bleeding was controlled with Pressure. The procedure was tolerated well with a pain level of 0 throughout and a pain level of 0 following the procedure. Post Debridement Measurements: 5.5cm length x 2.5cm width x 0.3cm depth; 3.24cm^3 volume. Post debridement Stage noted as Category/Stage III. Character of Wound/Ulcer Post Debridement is improved. Post procedure Diagnosis Wound #2: Same as Pre-Procedure Plan Follow-up Appointments: Return  Appointment in 1 week. - Thurs. w/ Dr. Heber Noble and Elias Else # 9 Bathing/ Shower/ Hygiene: May shower with protection but do not get wound dressing(s) wet. Off-Loading: Turn and reposition every 2 hours ordered were: PCR culture - Sacral wound WOUND #2: - Sacrum Wound Laterality: Cleanser: Soap and Water 1 x Per Day/15 Days Discharge Instructions: May shower and wash wound with dial antibacterial soap and water prior to dressing change. Cleanser: Wound Cleanser (DME) (Generic) 1 x Per Day/15 Days Discharge Instructions: Cleanse the wound with wound cleanser prior to applying a clean dressing using gauze sponges, not tissue or cotton balls. Peri-Wound Care: Skin Prep (DME) (Generic) 1 x Per Day/15 Days Discharge Instructions: Use skin prep as directed Prim Dressing: KerraCel Ag Gelling Fiber Dressing, 4x5 in (silver alginate) (DME) (Generic) 1 x Per Day/15 Days ary Discharge Instructions: Apply silver alginate to wound bed as instructed Secondary Dressing: Zetuvit Plus Silicone Border Dressing 5x5 (in/in) (DME) (Generic) 1 x Per Day/15 Days Discharge Instructions: Apply silicone border over primary dressing as directed. Secured With: 59M Medipore H Soft Cloth Surgical T ape, 4 x 10 (in/yd) (DME) (Generic) 1 x Per Day/15 Days Discharge Instructions: Secure with tape as directed. 1. Aggressive debridement to free up the edges of the wound and also a gritty senescent surface. Hemostasis with direct pressure 2. I did a deep tissue culture for PCR. No need for empiric antibiotics 3. Indeed its not going to be easy to get a dressing to adhere to this area. Elected to use silver alginate with border foam carefully melted to the crease between the gluteal cleft Electronic Signature(s) Signed: 06/30/2022 4:35:07 PM By: Linton Ham MD Entered By: Linton Ham on 06/30/2022 14:19:39 -------------------------------------------------------------------------------- HxROS Details Patient Name:  Date of Service: [name] [name] 06/30/2022 1:00 PM Medical Record Number: [id number] Patient Account Number: [id number] Date of Birth/Sex: Treating RN: 1955-11-19 (66 y.o. [name], [name] Primary Care Provider: [name] Other Clinician: Referring Provider: Treating Provider/Extender: Benita Gutter RT, JENNIFER Weeks in Treatment: 0 Information Obtained From Patient Integumentary (Skin) Complaints and Symptoms: Positive for: Wounds Medical History: Negative for: History of Burn Eyes Medical History: Negative for: Cataracts; Glaucoma; Optic Neuritis Ear/Nose/Mouth/Throat Medical History: Negative for: Chronic sinus problems/congestion; Middle ear problems Hematologic/Lymphatic Medical History: Negative for: Anemia; Hemophilia; Human Immunodeficiency Virus; Lymphedema; Sickle Cell Disease Respiratory Medical History: Negative for: Aspiration; Asthma; Chronic Obstructive Pulmonary  Disease (COPD); Pneumothorax; Sleep Apnea; Tuberculosis Cardiovascular Medical History: Positive for: Hypertension Negative for: Angina; Arrhythmia; Congestive Heart Failure; Coronary Artery Disease; Deep Vein Thrombosis; Hypotension; Myocardial Infarction; Peripheral Arterial Disease; Peripheral Venous Disease; Phlebitis; Vasculitis Gastrointestinal Medical History: Negative for: Cirrhosis ; Colitis; Crohns; Hepatitis A; Hepatitis B; Hepatitis C Endocrine Medical History: Negative for: Type I Diabetes; Type II Diabetes Genitourinary Medical History: Negative for: End Stage Renal Disease Past Medical History Notes: self cath Immunological Medical History: Negative for: Lupus Erythematosus; Raynauds; Scleroderma Musculoskeletal Medical History: Negative for: Gout; Rheumatoid Arthritis; Osteoarthritis; Osteomyelitis Neurologic Medical History: Positive for: Paraplegia - T12 Negative for: Dementia; Neuropathy; Quadriplegia; Seizure Disorder Oncologic Medical  History: Negative for: Received Chemotherapy; Received Radiation Psychiatric Medical History: Negative for: Anorexia/bulimia; Confinement Anxiety Immunizations Pneumococcal Vaccine: Received Pneumococcal Vaccination: No Implantable Devices No devices added Hospitalization / Surgery History Type of Hospitalization/Surgery back surgery Family and Social History Cancer: No; Diabetes: Yes - Mother; Heart Disease: No; Hereditary Spherocytosis: No; Hypertension: Yes - Mother,Father; Kidney Disease: Yes - Mother; Lung Disease: No; Seizures: No; Stroke: No; Thyroid Problems: No; Tuberculosis: No; Never smoker; Marital Status - Married; Alcohol Use: Rarely; Drug Use: No History; Caffeine Use: Daily - coffee; Financial Concerns: No; Food, Clothing or Shelter Needs: No; Support System Lacking: No; Transportation Concerns: No Electronic Signature(s) Signed: 06/30/2022 4:08:15 PM By: Rhae Hammock RN Signed: 06/30/2022 4:35:07 PM By: Linton Ham MD Entered By: Rhae Hammock on 06/30/2022 13:13:48 -------------------------------------------------------------------------------- SuperBill Details Patient Name: Date of Service: [name] [name] 06/30/2022 Medical Record Number: [id number] Patient Account Number: [id number] Date of Birth/Sex: Treating RN: 03-04-1956 (66 y.o. [name], [name] Primary Care Provider: [name] Other Clinician: Referring Provider: Treating Provider/Extender: Benita Gutter RT, JENNIFER Weeks in Treatment: 0 Diagnosis Coding ICD-10 Codes Code Description [phone] Pressure ulcer of sacral region, stage 3 S24.114D Complete lesion at T11-T12 level of thoracic spinal cord, subsequent encounter Facility Procedures CPT4 Code: [id number] Description: 25638 - DEB SUBQ TISSUE 20 SQ CM/< ICD-10 Diagnosis Description L89.153 Pressure ulcer of sacral region, stage 3 Modifier: Quantity: 1 Physician Procedures : CPT4 Code Description Modifier  [id number] - WC PHYS LEVEL 3 - EST PT 25 ICD-10 Diagnosis Description L89.153 Pressure ulcer of sacral region, stage 3 S24.114D Complete lesion at T11-T12 level of thoracic spinal cord, subsequent encounter Quantity: 1 Electronic Signature(s) Signed: 06/30/2022 4:35:07 PM By: Linton Ham MD Entered By: Linton Ham on 06/30/2022 14:20:08

## 2022-06-30 NOTE — Progress Notes (Signed)
TRYTON, BODI ([id number]) Visit Report for 06/30/2022 Allergy List Details Patient Name: Date of Service: Dione Housekeeper [name] 06/30/2022 1:00 PM Medical Record Number: [id number] Patient Account Number: [id number] Date of Birth/Sex: Treating RN: 10/13/1956 (66 y.o. [name] Primary Care Trevious Rampey: [name] Other Clinician: Referring Asli Tokarski: Treating Shirley Decamp/Extender: Benita Gutter RT, JENNIFER Weeks in Treatment: 0 Allergies Active Allergies Sulfa (Sulfonamide Antibiotics) Reaction: rash Severity: Moderate Allergy Notes Electronic Signature(s) Signed: 06/30/2022 4:08:15 PM By: Rhae Hammock RN Entered By: Rhae Hammock on 06/30/2022 13:16:13 -------------------------------------------------------------------------------- Arrival Information Details Patient Name: Date of Service: [name] [name] 06/30/2022 1:00 PM Medical Record Number: [id number] Patient Account Number: [id number] Date of Birth/Sex: Treating RN: 01-Aug-1956 (66 y.o. [name], [name] Primary Care Eriyonna Matsushita: [name] Other Clinician: Referring Naisha Wisdom: Treating Pierce Biagini/Extender: Benita Gutter RT, JENNIFER Weeks in Treatment: 0 Visit Information Patient Arrived: Wheel Chair Arrival Time: 13:12 Accompanied By: self Transfer Assistance: Manual Patient Identification Verified: Yes Secondary Verification Process Completed: Yes Patient Requires Transmission-Based Precautions: No Patient Has Alerts: No History Since Last Visit Added or deleted any medications: No Any new allergies or adverse reactions: No Had a fall or experienced change in activities of daily living that may affect risk of falls: No Signs or symptoms of abuse/neglect since last visito No Hospitalized since last visit: No Implantable device outside of the clinic excluding cellular tissue based products placed in the center since last visit: No Electronic Signature(s) Signed: 06/30/2022  4:08:15 PM By: Rhae Hammock RN Entered By: Rhae Hammock on 06/30/2022 13:13:08 -------------------------------------------------------------------------------- Clinic Level of Care Assessment Details Patient Name: Date of Service: [name] [name] 06/30/2022 1:00 PM Medical Record Number: [id number] Patient Account Number: [id number] Date of Birth/Sex: Treating RN: 30-Apr-1956 (66 y.o. [name], [name] Primary Care Betania Dizon: [name] Other Clinician: Referring Madhav Mohon: Treating Tammatha Cobb/Extender: Benita Gutter RT, JENNIFER Weeks in Treatment: 0 Clinic Level of Care Assessment Items TOOL 3 Quantity Score X- 1 0 Use when EandM and Procedure is performed on FOLLOW-UP visit ASSESSMENTS - Nursing Assessment / Reassessment X- 1 10 Reassessment of Co-morbidities (includes updates in patient status) X- 1 5 Reassessment of Adherence to Treatment Plan ASSESSMENTS - Wound and Skin Assessment / Reassessment '[]'$  - Points for Wound Assessment can only be taken for a new wound of unknown or different etiology and a procedure is 0 NOT performed to that wound X- 1 5 Simple Wound Assessment / Reassessment - one wound '[]'$  - 0 Complex Wound Assessment / Reassessment - multiple wounds '[]'$  - 0 Dermatologic / Skin Assessment (not related to wound area) ASSESSMENTS - Focused Assessment '[]'$  - 0 Circumferential Edema Measurements - multi extremities '[]'$  - 0 Nutritional Assessment / Counseling / Intervention '[]'$  - 0 Lower Extremity Assessment (monofilament, tuning fork, pulses) '[]'$  - 0 Peripheral Arterial Disease Assessment (using hand held doppler) ASSESSMENTS - Ostomy and/or Continence Assessment and Care '[]'$  - 0 Incontinence Assessment and Management '[]'$  - 0 Ostomy Care Assessment and Management (repouching, etc.) PROCESS - Coordination of Care '[]'$  - Points for Discharge Coordination can only be taken for a new wound of unknown or different etiology and a procedure 0 is  NOT performed to that wound X- 1 15 Simple Patient / Family Education for ongoing care '[]'$  - 0 Complex (extensive) Patient / Family Education for ongoing care X- 1 10 Staff obtains Programmer, systems, Records, T Results / Process Orders est '[]'$  - 0 Staff telephones HHA, Nursing Homes / Clarify orders /  etc '[]'$  - 0 Routine Transfer to another Facility (non-emergent condition) '[]'$  - 0 Routine Hospital Admission (non-emergent condition) X- 1 15 New Admissions / Biomedical engineer / Ordering NPWT Apligraf, etc. , '[]'$  - 0 Emergency Hospital Admission (emergent condition) X- 1 10 Simple Discharge Coordination '[]'$  - 0 Complex (extensive) Discharge Coordination PROCESS - Special Needs '[]'$  - 0 Pediatric / Minor Patient Management '[]'$  - 0 Isolation Patient Management '[]'$  - 0 Hearing / Language / Visual special needs '[]'$  - 0 Assessment of Community assistance (transportation, D/C planning, etc.) '[]'$  - 0 Additional assistance / Altered mentation '[]'$  - 0 Support Surface(s) Assessment (bed, cushion, seat, etc.) INTERVENTIONS - Wound Cleansing / Measurement '[]'$  - Points for Wound Cleaning / Measurement, Wound Dressing, Specimen Collection and Specimen taken to lab can only 0 be taken for a new wound of unknown or different etiology and a procedure is NOT performed to that wound X- 1 5 Simple Wound Cleansing - one wound '[]'$  - 0 Complex Wound Cleansing - multiple wounds X- 1 5 Wound Imaging (photographs - any number of wounds) '[]'$  - 0 Wound Tracing (instead of photographs) X- 1 5 Simple Wound Measurement - one wound '[]'$  - 0 Complex Wound Measurement - multiple wounds INTERVENTIONS - Wound Dressings X - Small Wound Dressing one or multiple wounds 1 10 '[]'$  - 0 Medium Wound Dressing one or multiple wounds '[]'$  - 0 Large Wound Dressing one or multiple wounds INTERVENTIONS - Miscellaneous '[]'$  - 0 External ear exam X- 1 5 Specimen Collection (cultures, biopsies, blood, body fluids, etc.) X- 1  5 Specimen(s) / Culture(s) sent or taken to Lab for analysis '[]'$  - 0 Patient Transfer (multiple staff / Civil Service fast streamer / Similar devices) '[]'$  - 0 Simple Staple / Suture removal (25 or less) '[]'$  - 0 Complex Staple / Suture removal (26 or more) '[]'$  - 0 Hypo / Hyperglycemic Management (close monitor of Blood Glucose) '[]'$  - 0 Ankle / Brachial Index (ABI) - do not check if billed separately X- 1 5 Vital Signs Has the patient been seen at the hospital within the last three years: Yes Total Score: 110 Level Of Care: New/Established - Level 3 Electronic Signature(s) Signed: 06/30/2022 4:08:15 PM By: Rhae Hammock RN Entered By: Rhae Hammock on 06/30/2022 14:06:45 -------------------------------------------------------------------------------- Encounter Discharge Information Details Patient Name: Date of Service: [name] [name] 06/30/2022 1:00 PM Medical Record Number: [id number] Patient Account Number: [id number] Date of Birth/Sex: Treating RN: 1956/03/10 (66 y.o. [name], [name] Primary Care Timmy Cleverly: [name] Other Clinician: Referring Kinjal Neitzke: Treating Maryruth Apple/Extender: Benita Gutter RT, JENNIFER Weeks in Treatment: 0 Encounter Discharge Information Items Post Procedure Vitals Discharge Condition: Stable Temperature (F): 98.7 Ambulatory Status: Wheelchair Pulse (bpm): 74 Discharge Destination: Home Respiratory Rate (breaths/min): 17 Transportation: Private Auto Blood Pressure (mmHg): 140/74 Accompanied By: self Schedule Follow-up Appointment: Yes Clinical Summary of Care: Patient Declined Electronic Signature(s) Signed: 06/30/2022 4:08:15 PM By: Rhae Hammock RN Entered By: Rhae Hammock on 06/30/2022 14:39:16 -------------------------------------------------------------------------------- Lower Extremity Assessment Details Patient Name: Date of Service: [name] [name] 06/30/2022 1:00 PM Medical Record Number: [id number] Patient Account  Number: [id number] Date of Birth/Sex: Treating RN: 1955/12/13 (66 y.o. [name] Primary Care Porfiria Heinrich: [name] Other Clinician: Referring Tryone Kille: Treating Zidane Renner/Extender: Benita Gutter RT, JENNIFER Weeks in Treatment: 0 Electronic Signature(s) Signed: 06/30/2022 4:08:15 PM By: Rhae Hammock RN Entered By: Rhae Hammock on 06/30/2022 13:18:29 -------------------------------------------------------------------------------- Multi Wound Chart Details Patient Name: Date of Service: [name] MES  E. 06/30/2022 1:00 PM Medical Record Number: [id number] Patient Account Number: [id number] Date of Birth/Sex: Treating RN: 06/07/1956 (66 y.o. [name], [name] Primary Care Kerrington Sova: [name] Other Clinician: Referring Tokiko Diefenderfer: Treating Ajay Strubel/Extender: Benita Gutter RT, JENNIFER Weeks in Treatment: 0 Vital Signs Height(in): Pulse(bpm): 69 Weight(lbs): Blood Pressure(mmHg): 154/77 Body Mass Index(BMI): Temperature(F): 98.4 Respiratory Rate(breaths/min): 17 Photos: [N/A:N/A] Sacrum N/A N/A Wound Location: Pressure Injury N/A N/A Wounding Event: Pressure Ulcer N/A N/A Primary Etiology: Hypertension, Paraplegia N/A N/A Comorbid History: 03/17/2022 N/A N/A Date Acquired: 0 N/A N/A Weeks of Treatment: Open N/A N/A Wound Status: No N/A N/A Wound Recurrence: 5.5x2.5x0.3 N/A N/A Measurements L x W x D (cm) 10.799 N/A N/A A (cm) : rea 3.24 N/A N/A Volume (cm) : 0.00% N/A N/A % Reduction in A rea: 0.00% N/A N/A % Reduction in Volume: Category/Stage III N/A N/A Classification: Medium N/A N/A Exudate A mount: Serosanguineous N/A N/A Exudate Type: red, brown N/A N/A Exudate Color: Distinct, outline attached N/A N/A Wound Margin: Large (67-100%) N/A N/A Granulation A mount: Red, Pink N/A N/A Granulation Quality: Small (1-33%) N/A N/A Necrotic A mount: Fat Layer (Subcutaneous Tissue): Yes N/A  N/A Exposed Structures: Fascia: No Tendon: No Muscle: No Joint: No Bone: No None N/A N/A Epithelialization: Debridement - Excisional N/A N/A Debridement: Pre-procedure Verification/Time Out 13:50 N/A N/A Taken: Subcutaneous, Slough N/A N/A Tissue Debrided: Skin/Subcutaneous Tissue N/A N/A Level: 13.75 N/A N/A Debridement A (sq cm): rea Curette N/A N/A Instrument: Minimum N/A N/A Bleeding: Pressure N/A N/A Hemostasis A chieved: 0 N/A N/A Procedural Pain: 0 N/A N/A Post Procedural Pain: Procedure was tolerated well N/A N/A Debridement Treatment Response: 5.5x2.5x0.3 N/A N/A Post Debridement Measurements L x W x D (cm) 3.24 N/A N/A Post Debridement Volume: (cm) Category/Stage III N/A N/A Post Debridement Stage: Debridement N/A N/A Procedures Performed: Treatment Notes Electronic Signature(s) Signed: 06/30/2022 4:08:15 PM By: Rhae Hammock RN Signed: 06/30/2022 4:35:07 PM By: Linton Ham MD Entered By: Linton Ham on 06/30/2022 14:12:50 -------------------------------------------------------------------------------- Multi-Disciplinary Care Plan Details Patient Name: Date of Service: Dione Housekeeper [name] 06/30/2022 1:00 PM Medical Record Number: [id number] Patient Account Number: [id number] Date of Birth/Sex: Treating RN: 05-26-1956 (66 y.o. [name], [name] Primary Care Markis Langland: [name] Other Clinician: Referring Jvon Meroney: Treating Alexxus Sobh/Extender: Benita Gutter RT, JENNIFER Weeks in Treatment: 0 Active Inactive Orientation to the Wound Care Program Nursing Diagnoses: Knowledge deficit related to the wound healing center program Goals: Patient/caregiver will verbalize understanding of the [place] Date Initiated: 06/30/2022 Target Resolution Date: 07/20/2022 Goal Status: Active Interventions: Provide education on orientation to the wound center Notes: Wound/Skin Impairment Nursing Diagnoses: Impaired  tissue integrity Knowledge deficit related to ulceration/compromised skin integrity Goals: Patient will have a decrease in wound volume by X% from date: (specify in notes) Date Initiated: 06/30/2022 Target Resolution Date: 07/22/2022 Goal Status: Active Patient/caregiver will verbalize understanding of skin care regimen Date Initiated: 06/30/2022 Target Resolution Date: 07/23/2022 Goal Status: Active Ulcer/skin breakdown will have a volume reduction of 30% by week 4 Date Initiated: 06/30/2022 Target Resolution Date: 07/20/2022 Goal Status: Active Interventions: Assess patient/caregiver ability to obtain necessary supplies Assess patient/caregiver ability to perform ulcer/skin care regimen upon admission and as needed Assess ulceration(s) every visit Notes: Electronic Signature(s) Signed: 06/30/2022 4:08:15 PM By: Rhae Hammock RN Entered By: Rhae Hammock on 06/30/2022 13:45:50 -------------------------------------------------------------------------------- Pain Assessment Details Patient Name: Date of Service: [name] [name] 06/30/2022 1:00 PM Medical Record Number: [id number] Patient Account Number: [id number] Date  of Birth/Sex: Treating RN: 06-Jan-1956 (66 y.o. [name] Primary Care Awesome Jared: [name] Other Clinician: Referring Allayna Erlich: Treating Jahvier Aldea/Extender: Benita Gutter RT, JENNIFER Weeks in Treatment: 0 Active Problems Location of Pain Severity and Description of Pain Patient Has Paino No Site Locations Pain Management and Medication Current Pain Management: Electronic Signature(s) Signed: 06/30/2022 4:08:15 PM By: Rhae Hammock RN Entered By: Rhae Hammock on 06/30/2022 13:18:37 -------------------------------------------------------------------------------- Patient/Caregiver Education Details Patient Name: Date of Service: Dione Housekeeper [name] 9/14/2023andnbsp1:00 PM Medical Record Number: [id number] Patient Account  Number: [id number] Date of Birth/Gender: Treating RN: [birth year] (66 y.o. [name] Primary Care Physician: [name] Other Clinician: Referring Physician: Treating Physician/Extender: Benita Gutter RT, JENNIFER Weeks in Treatment: 0 Education Assessment Education Provided To: Patient Education Topics Provided Welcome T [facility]: o Methods: Explain/Verbal Responses: Reinforcements needed, State content correctly Electronic Signature(s) Signed: 06/30/2022 4:08:15 PM By: Rhae Hammock RN Entered By: Rhae Hammock on 06/30/2022 13:46:00 -------------------------------------------------------------------------------- Wound Assessment Details Patient Name: Date of Service: Dione Housekeeper [name] 06/30/2022 1:00 PM Medical Record Number: [id number] Patient Account Number: [id number] Date of Birth/Sex: Treating RN: [birth year] (66 y.o. [name], [name] Primary Care Yenty Bloch: [name] Other Clinician: Referring Shikira Folino: Treating Brenda Samano/Extender: Benita Gutter RT, JENNIFER Weeks in Treatment: 0 Wound Status Wound Number: 2 Primary Etiology: Pressure Ulcer Wound Location: Sacrum Wound Status: Open Wounding Event: Pressure Injury Comorbid History: Hypertension, Paraplegia Date Acquired: 03/17/2022 Weeks Of Treatment: 0 Clustered Wound: No Photos Wound Measurements Length: (cm) 5.5 Width: (cm) 2.5 Depth: (cm) 0.3 Area: (cm) 10.799 Volume: (cm) 3.24 % Reduction in Area: 0% % Reduction in Volume: 0% Epithelialization: None Tunneling: No Undermining: No Wound Description Classification: Category/Stage III Wound Margin: Distinct, outline attached Exudate Amount: Medium Exudate Type: Serosanguineous Exudate Color: red, brown Foul Odor After Cleansing: No Slough/Fibrino Yes Wound Bed Granulation Amount: Large (67-100%) Exposed Structure Granulation Quality: Red, Pink Fascia Exposed: No Necrotic Amount:  Small (1-33%) Fat Layer (Subcutaneous Tissue) Exposed: Yes Necrotic Quality: Adherent Slough Tendon Exposed: No Muscle Exposed: No Joint Exposed: No Bone Exposed: No Treatment Notes Wound #2 (Sacrum) Cleanser Soap and Water Discharge Instruction: May shower and wash wound with dial antibacterial soap and water prior to dressing change. Wound Cleanser Discharge Instruction: Cleanse the wound with wound cleanser prior to applying a clean dressing using gauze sponges, not tissue or cotton balls. Peri-Wound Care Skin Prep Discharge Instruction: Use skin prep as directed Topical Primary Dressing KerraCel Ag Gelling Fiber Dressing, 4x5 in (silver alginate) Discharge Instruction: Apply silver alginate to wound bed as instructed Secondary Dressing Zetuvit Plus Silicone Border Dressing 5x5 (in/in) Discharge Instruction: Apply silicone border over primary dressing as directed. Secured With 25M Medipore H Soft Cloth Surgical T ape, 4 x 10 (in/yd) Discharge Instruction: Secure with tape as directed. Compression Wrap Compression Stockings Add-Ons Electronic Signature(s) Signed: 06/30/2022 4:08:15 PM By: Rhae Hammock RN Entered By: Rhae Hammock on 06/30/2022 13:34:23 -------------------------------------------------------------------------------- Vitals Details Patient Name: Date of Service: [name] [name] 06/30/2022 1:00 PM Medical Record Number: [id number] Patient Account Number: [id number] Date of Birth/Sex: Treating RN: 09-Oct-1956 (66 y.o. [name] Primary Care Claborn Janusz: [name] Other Clinician: Referring Chrissie Dacquisto: Treating Breawna Montenegro/Extender: Benita Gutter RT, JENNIFER Weeks in Treatment: 0 Vital Signs Time Taken: 13:15 Temperature (F): 98.4 Pulse (bpm): 69 Respiratory Rate (breaths/min): 17 Blood Pressure (mmHg): 154/77 Reference Range: 80 - 120 mg / dl Electronic Signature(s) Signed: 06/30/2022 4:08:15 PM By: Rhae Hammock  RN Entered By: Rhae Hammock on 06/30/2022 13:16:10

## 2022-06-30 NOTE — Progress Notes (Signed)
Jon Wells ([id number]) Visit Report for 06/30/2022 Abuse Risk Screen Details Patient Name: Date of Service: Jon Wells [name] 06/30/2022 1:00 PM Medical Record Number: [id number] Patient Account Number: [id number] Date of Birth/Sex: Treating RN: 27-Jan-1956 (66 y.o. Jon Wells, Jon Wells Primary Care Jon Wells: Jon Wells Other Clinician: Referring Zareena Willis: Treating Joshalyn Ancheta/Extender: Benita Gutter RT, JENNIFER Weeks in Treatment: 0 Abuse Risk Screen Items Answer ABUSE RISK SCREEN: Has anyone close to you tried to hurt or harm you recentlyo No Do you feel uncomfortable with anyone in your familyo No Has anyone forced you do things that you didnt want to doo No Electronic Signature(s) Signed: 06/30/2022 4:08:15 PM By: Rhae Hammock RN Entered By: Rhae Hammock on 06/30/2022 13:13:57 -------------------------------------------------------------------------------- Activities of Daily Living Details Patient Name: Date of Service: Jon Wells [name] 06/30/2022 1:00 PM Medical Record Number: [id number] Patient Account Number: [id number] Date of Birth/Sex: Treating RN: [birth year] (66 y.o. Jon Wells, Jon Wells Primary Care Jon Wells: Jon Wells Other Clinician: Referring Yaxiel Minnie: Treating Guy Seese/Extender: Benita Gutter RT, JENNIFER Weeks in Treatment: 0 Activities of Daily Living Items Answer Activities of Daily Living (Please select one for each item) Drive Automobile Completely Able T Medications ake Completely Able Use T elephone Completely Able Care for Appearance Completely Able Use T oilet Completely Able Bath / Shower Completely Able Dress Self Completely Able Feed Self Completely Able Walk Not Able Get In / Out Bed Completely Able Housework Completely Able Prepare Meals Completely [place] for Self Completely Able Electronic Signature(s) Signed: 06/30/2022 4:08:15 PM By: Rhae Hammock RN Entered By:  Rhae Hammock on 06/30/2022 13:17:15 -------------------------------------------------------------------------------- Education Screening Details Patient Name: Date of Service: Jon Wells [name] 06/30/2022 1:00 PM Medical Record Number: [id number] Patient Account Number: [id number] Date of Birth/Sex: Treating RN: 1956/06/01 (66 y.o. Jon Wells, Jon Wells Primary Care Jon Wells: Jon Wells Other Clinician: Referring Easton Sivertson: Treating Mareena Cavan/Extender: Benita Gutter RT, JENNIFER Weeks in Treatment: 0 Primary Learner Assessed: Patient Learning Preferences/Education Level/Primary Language Learning Preference: Explanation, Demonstration, Communication Board, Printed Material Highest Education Level: High School Preferred Language: English Cognitive Barrier Language Barrier: No Translator Needed: No Memory Deficit: No Emotional Barrier: No Cultural/Religious Beliefs Affecting Medical Care: No Physical Barrier Impaired Vision: Yes Glasses Impaired Hearing: No Decreased Hand dexterity: No Knowledge/Comprehension Knowledge Level: High Comprehension Level: High Ability to understand written instructions: High Ability to understand verbal instructions: High Motivation Anxiety Level: Calm Cooperation: Cooperative Education Importance: Denies Need Interest in Health Problems: Asks Questions Perception: Coherent Willingness to Engage in Self-Management High Activities: Readiness to Engage in Self-Management High Activities: Electronic Signature(s) Signed: 06/30/2022 4:08:15 PM By: Rhae Hammock RN Entered By: Rhae Hammock on 06/30/2022 13:17:54 -------------------------------------------------------------------------------- Fall Risk Assessment Details Patient Name: Date of Service: Jon Wells [name] 06/30/2022 1:00 PM Medical Record Number: [id number] Patient Account Number: [id number] Date of Birth/Sex: Treating RN: August 17, 1956 (66 y.o. Jon Wells,  Jon Wells Primary Care Carletta Feasel: Jon Wells Other Clinician: Referring Rasean Joos: Treating Akshay Spang/Extender: Benita Gutter RT, JENNIFER Weeks in Treatment: 0 Fall Risk Assessment Items Have you had 2 or more falls in the last 12 monthso 0 No Have you had any fall that resulted in injury in the last 12 monthso 0 No FALLS RISK SCREEN History of falling - immediate or within 3 months 0 No Secondary diagnosis (Do you have 2 or more medical diagnoseso) 0 No Ambulatory aid None/bed rest/wheelchair/nurse 0 No Crutches/cane/walker 0 No Furniture 0 No Intravenous therapy Access/Saline/Heparin Lock 0  No Gait/Transferring Normal/ bed rest/ wheelchair 0 No Weak (short steps with or without shuffle, stooped but able to lift head while walking, may seek 0 No support from furniture) Impaired (short steps with shuffle, may have difficulty arising from chair, head down, impaired 0 No balance) Mental Status Oriented to own ability 0 No Electronic Signature(s) Signed: 06/30/2022 4:08:15 PM By: Rhae Hammock RN Entered By: Rhae Hammock on 06/30/2022 13:18:01 -------------------------------------------------------------------------------- Foot Assessment Details Patient Name: Date of Service: Jon Wells [name] 06/30/2022 1:00 PM Medical Record Number: [id number] Patient Account Number: [id number] Date of Birth/Sex: Treating RN: 03/06/1956 (66 y.o. Jon Wells Primary Care [name]: Jon Wells Other Clinician: Referring [name]: Treating [name]/Extender: Benita Gutter RT, JENNIFER Weeks in Treatment: 0 Foot Assessment Items Site Locations + = Sensation present, - = Sensation absent, C = Callus, U = Ulcer R = Redness, W = Warmth, M = Maceration, PU = Pre-ulcerative lesion F = Fissure, S = Swelling, D = Dryness Assessment Right: Left: Other Deformity: No No Prior Foot Ulcer: No No Prior Amputation: No No Charcot Joint: No No Ambulatory  Status: Gait: Notes N/A no LE wounds Electronic Signature(s) Signed: 06/30/2022 4:08:15 PM By: Rhae Hammock RN Entered By: Rhae Hammock on 06/30/2022 13:18:23 -------------------------------------------------------------------------------- Nutrition Risk Screening Details Patient Name: Date of Service: Jon Wells [name] 06/30/2022 1:00 PM Medical Record Number: [id number] Patient Account Number: [id number] Date of Birth/Sex: Treating RN: April 28, 1956 (66 y.o. Jon Wells Primary Care Tirrell Buchberger: Jon Wells Other Clinician: Referring Jazlin Tapscott: Treating Jahlen Bollman/Extender: Benita Gutter RT, JENNIFER Weeks in Treatment: 0 Height (in): Weight (lbs): Body Mass Index (BMI): Nutrition Risk Screening Items Score Screening NUTRITION RISK SCREEN: I have an illness or condition that made me change the kind and/or amount of food I eat 0 No I eat fewer than two meals per day 0 No I eat few fruits and vegetables, or milk products 0 No I have three or more drinks of beer, liquor or wine almost every day 0 No I have tooth or mouth problems that make it hard for me to eat 0 No I don't always have enough money to buy the food I need 0 No I eat alone most of the time 0 No I take three or more different prescribed or over-the-counter drugs a day 0 No Without wanting to, I have lost or gained 10 pounds in the last six months 0 No I am not always physically able to shop, cook and/or feed myself 0 No Nutrition Protocols Good Risk Protocol 0 No interventions needed Moderate Risk Protocol High Risk Proctocol Risk Level: Good Risk Score: 0 Electronic Signature(s) Signed: 06/30/2022 4:08:15 PM By: Rhae Hammock RN Entered By: Rhae Hammock on 06/30/2022 13:18:11

## 2022-07-08 ENCOUNTER — Encounter (HOSPITAL_BASED_OUTPATIENT_CLINIC_OR_DEPARTMENT_OTHER): Payer: Medicare HMO | Admitting: Internal Medicine

## 2022-07-08 DIAGNOSIS — L89153 Pressure ulcer of sacral region, stage 3: Secondary | ICD-10-CM

## 2022-07-08 NOTE — Progress Notes (Signed)
NAVON, KOTOWSKI ([id number]) Visit Report for 07/08/2022 Arrival Information Details Patient Name: Date of Service: [name] [name] 07/08/2022 10:45 A M Medical Record Number: [id number] Patient Account Number: [id number] Date of Birth/Sex: Treating RN: 27-Apr-1956 (66 y.o. [name], [name] Primary Care Alizey Noren: [name] Other Clinician: Referring Tommye Lehenbauer: Treating [place] Basham/Extender: Drucilla Chalet RT, JENNIFER Weeks in Treatment: 1 Visit Information History Since Last Visit Added or deleted any medications: No Patient Arrived: Wheel Chair Any new allergies or adverse reactions: No Arrival Time: 11:00 Had a fall or experienced change in No Transfer Assistance: None activities of daily living that may affect Patient Identification Verified: Yes risk of falls: Secondary Verification Process Completed: Yes Signs or symptoms of abuse/neglect since last visito No Patient Requires Transmission-Based Precautions: No Hospitalized since last visit: No Patient Has Alerts: No Implantable device outside of the clinic excluding No cellular tissue based products placed in the center since last visit: Has Dressing in Place as Prescribed: Yes Pain Present Now: No Electronic Signature(s) Signed: 07/08/2022 12:30:24 PM By: Erenest Blank Entered By: Erenest Blank on 07/08/2022 11:01:26 -------------------------------------------------------------------------------- Encounter Discharge Information Details Patient Name: Date of Service: Dione Housekeeper [name] 07/08/2022 10:45 A M Medical Record Number: [id number] Patient Account Number: [id number] Date of Birth/Sex: Treating RN: April 04, 1956 (66 y.o. [name] Primary Care Laya Letendre: [name] Other Clinician: Referring Delancey Moraes: Treating Neel Buffone/Extender: Drucilla Chalet RT, JENNIFER Weeks in Treatment: 1 Encounter Discharge Information Items Post Procedure Vitals Discharge Condition: Stable Temperature  (F): 98.7 Ambulatory Status: Wheelchair Pulse (bpm): 74 Discharge Destination: Home Respiratory Rate (breaths/min): 17 Transportation: Private Auto Blood Pressure (mmHg): 120/80 Accompanied By: self Schedule Follow-up Appointment: Yes Clinical Summary of Care: Patient Declined Electronic Signature(s) Signed: 07/08/2022 12:35:09 PM By: Rhae Hammock RN Entered By: Rhae Hammock on 07/08/2022 12:18:09 -------------------------------------------------------------------------------- Lower Extremity Assessment Details Patient Name: Date of Service: [name] [name] 07/08/2022 10:45 A M Medical Record Number: [id number] Patient Account Number: [id number] Date of Birth/Sex: Treating RN: 1955-12-09 (66 y.o. [name] Primary Care Amariona Rathje: [name] Other Clinician: Referring Tationna Fullard: Treating Mickaela Starlin/Extender: Drucilla Chalet RT, JENNIFER Weeks in Treatment: 1 Electronic Signature(s) Signed: 07/08/2022 12:30:24 PM By: Erenest Blank Signed: 07/08/2022 12:35:09 PM By: Rhae Hammock RN Entered By: Erenest Blank on 07/08/2022 11:02:21 -------------------------------------------------------------------------------- Multi Wound Chart Details Patient Name: Date of Service: Dione Housekeeper [name] 07/08/2022 10:45 A M Medical Record Number: [id number] Patient Account Number: [id number] Date of Birth/Sex: Treating RN: Nov 06, 1955 (66 y.o. [name] Primary Care Liahna Brickner: [name] Other Clinician: Referring Shyheem Whitham: Treating Alejos Reinhardt/Extender: Drucilla Chalet RT, JENNIFER Weeks in Treatment: 1 Vital Signs Height(in): Pulse(bpm): 75 Weight(lbs): Blood Pressure(mmHg): 154/82 Body Mass Index(BMI): Temperature(F): 98.9 Respiratory Rate(breaths/min): 18 Photos: [N/A:N/A] Sacrum N/A N/A Wound Location: Pressure Injury N/A N/A Wounding Event: Pressure Ulcer N/A N/A Primary Etiology: Hypertension, Paraplegia N/A N/A Comorbid  History: 03/17/2022 N/A N/A Date Acquired: 1 N/A N/A Weeks of Treatment: Open N/A N/A Wound Status: No N/A N/A Wound Recurrence: 4.7x3.5x0.3 N/A N/A Measurements L x W x D (cm) 12.92 N/A N/A A (cm) : rea 3.876 N/A N/A Volume (cm) : -19.60% N/A N/A % Reduction in A rea: -19.60% N/A N/A % Reduction in Volume: Category/Stage III N/A N/A Classification: Medium N/A N/A Exudate A mount: Serosanguineous N/A N/A Exudate Type: red, brown N/A N/A Exudate Color: Distinct, outline attached N/A N/A Wound Margin: Large (67-100%) N/A N/A Granulation A mount: Red, Pink N/A N/A Granulation Quality: None  Present (0%) N/A N/A Necrotic A mount: Fat Layer (Subcutaneous Tissue): Yes N/A N/A Exposed Structures: Fascia: No Tendon: No Muscle: No Joint: No Bone: No None N/A N/A Epithelialization: Debridement - Excisional N/A N/A Debridement: Pre-procedure Verification/Time Out 11:43 N/A N/A Taken: Lidocaine N/A N/A Pain Control: Subcutaneous, Slough N/A N/A Tissue Debrided: Skin/Subcutaneous Tissue N/A N/A Level: 16.45 N/A N/A Debridement A (sq cm): rea Curette N/A N/A Instrument: Minimum N/A N/A Bleeding: Pressure N/A N/A Hemostasis A chieved: 0 N/A N/A Procedural Pain: 0 N/A N/A Post Procedural Pain: Procedure was tolerated well N/A N/A Debridement Treatment Response: 4.7x3.5x0.3 N/A N/A Post Debridement Measurements L x W x D (cm) 3.876 N/A N/A Post Debridement Volume: (cm) Category/Stage III N/A N/A Post Debridement Stage: Debridement N/A N/A Procedures Performed: Treatment Notes Electronic Signature(s) Signed: 07/08/2022 12:18:31 PM By: Kalman Shan DO Signed: 07/08/2022 12:35:09 PM By: Rhae Hammock RN Entered By: Kalman Shan on 07/08/2022 11:52:58 -------------------------------------------------------------------------------- Multi-Disciplinary Care Plan Details Patient Name: Date of Service: Dione Housekeeper [name] 07/08/2022 10:45 A  M Medical Record Number: [id number] Patient Account Number: [id number] Date of Birth/Sex: Treating RN: 1956-02-10 (66 y.o. [name], [name] Primary Care Michaline Kindig: [name] Other Clinician: Referring Hans Rusher: Treating Daryon Remmert/Extender: Drucilla Chalet RT, JENNIFER Weeks in Treatment: 1 Active Inactive Orientation to the Wound Care Program Nursing Diagnoses: Knowledge deficit related to the wound healing center program Goals: Patient/caregiver will verbalize understanding of the [facility] Program Date Initiated: 06/30/2022 Target Resolution Date: 07/20/2022 Goal Status: Active Interventions: Provide education on orientation to the wound center Notes: Wound/Skin Impairment Nursing Diagnoses: Impaired tissue integrity Knowledge deficit related to ulceration/compromised skin integrity Goals: Patient will have a decrease in wound volume by X% from date: (specify in notes) Date Initiated: 06/30/2022 Target Resolution Date: 07/22/2022 Goal Status: Active Patient/caregiver will verbalize understanding of skin care regimen Date Initiated: 06/30/2022 Target Resolution Date: 07/23/2022 Goal Status: Active Ulcer/skin breakdown will have a volume reduction of 30% by week 4 Date Initiated: 06/30/2022 Target Resolution Date: 07/20/2022 Goal Status: Active Interventions: Assess patient/caregiver ability to obtain necessary supplies Assess patient/caregiver ability to perform ulcer/skin care regimen upon admission and as needed Assess ulceration(s) every visit Notes: Electronic Signature(s) Signed: 07/08/2022 12:35:09 PM By: Rhae Hammock RN Entered By: Rhae Hammock on 07/08/2022 11:51:35 -------------------------------------------------------------------------------- Pain Assessment Details Patient Name: Date of Service: Dione Housekeeper [name] 07/08/2022 10:45 A M Medical Record Number: [id number] Patient Account Number: [id number] Date of  Birth/Sex: Treating RN: 07-05-1956 (66 y.o. [name], [name] Primary Care Barbara Keng: [name] Other Clinician: Referring Tienna Bienkowski: Treating Margie Brink/Extender: Drucilla Chalet RT, JENNIFER Weeks in Treatment: 1 Active Problems Location of Pain Severity and Description of Pain Patient Has Paino No Site Locations Pain Management and Medication Current Pain Management: Electronic Signature(s) Signed: 07/08/2022 12:30:24 PM By: Erenest Blank Signed: 07/08/2022 12:35:09 PM By: Rhae Hammock RN Entered By: Erenest Blank on 07/08/2022 11:02:13 -------------------------------------------------------------------------------- Patient/Caregiver Education Details Patient Name: Date of Service: Dione Housekeeper [name] 9/22/2023andnbsp10:45 A M Medical Record Number: [id number] Patient Account Number: [id number] Date of Birth/Gender: Treating RN: [birth year] (66 y.o. [name] Primary Care Physician: [name] Other Clinician: Referring Physician: Treating Physician/Extender: Drucilla Chalet RT, JENNIFER Weeks in Treatment: 1 Education Assessment Education Provided To: Patient Education Topics Provided [place]: o Electronic Signature(s) Signed: 07/08/2022 12:35:09 PM By: Rhae Hammock RN Entered By: Rhae Hammock on 07/08/2022 12:16:52 -------------------------------------------------------------------------------- Wound Assessment Details Patient Name: Date of Service: [name]  [name] 07/08/2022 10:45 A M Medical Record Number: [id number] Patient Account Number: [id number] Date of Birth/Sex: Treating RN: [birth year] (66 y.o. [name], [name] Primary Care Kavir Savoca: [name] Other Clinician: Referring Nolin Grell: Treating Kasiyah Platter/Extender: Drucilla Chalet RT, JENNIFER Weeks in Treatment: 1 Wound Status Wound Number: 2 Primary Etiology: Pressure Ulcer Wound Location: Sacrum Wound Status:  Open Wounding Event: Pressure Injury Comorbid History: Hypertension, Paraplegia Date Acquired: 03/17/2022 Weeks Of Treatment: 1 Clustered Wound: No Photos Wound Measurements Length: (cm) 4.7 Width: (cm) 3.5 Depth: (cm) 0.3 Area: (cm) 12.92 Volume: (cm) 3.876 Wound Description Classification: Category/Stage III Wound Margin: Distinct, outline attached Exudate Amount: Medium Exudate Type: Serosanguineous Exudate Color: red, brown Foul Odor After Cleansing: Slough/Fibrino % Reduction in Area: -19.6% % Reduction in Volume: -19.6% Epithelialization: None Tunneling: No Undermining: No No Yes Wound Bed Granulation Amount: Large (67-100%) Exposed Structure Granulation Quality: Red, Pink Fascia Exposed: No Necrotic Amount: None Present (0%) Fat Layer (Subcutaneous Tissue) Exposed: Yes Tendon Exposed: No Muscle Exposed: No Joint Exposed: No Bone Exposed: No Treatment Notes Wound #2 (Sacrum) Cleanser Soap and Water Discharge Instruction: May shower and wash wound with dial antibacterial soap and water prior to dressing change. Wound Cleanser Discharge Instruction: Cleanse the wound with wound cleanser prior to applying a clean dressing using gauze sponges, not tissue or cotton balls. Peri-Wound Care Skin Prep Discharge Instruction: Use skin prep as directed Topical Primary Dressing Cutimed Sorbact Swab Discharge Instruction: Apply to wound bed as instructed AquacelAg Advantage Dressing, 4x5 (in/in) Secondary Dressing ABD Pad, 5x9 Discharge Instruction: Apply over primary dressing as directed. Woven Gauze Sponge, Non-Sterile 4x4 in Discharge Instruction: Apply over primary dressing as directed. Secured With 19M Medipore H Soft Cloth Surgical T ape, 4 x 10 (in/yd) Discharge Instruction: Secure with tape as directed. Compression Wrap Compression Stockings Add-Ons Electronic Signature(s) Signed: 07/08/2022 12:30:24 PM By: Erenest Blank Signed: 07/08/2022 12:35:09 PM  By: Rhae Hammock RN Entered By: Erenest Blank on 07/08/2022 11:10:33 -------------------------------------------------------------------------------- Vitals Details Patient Name: Date of Service: [name] [name] 07/08/2022 10:45 A M Medical Record Number: [id number] Patient Account Number: [id number] Date of Birth/Sex: Treating RN: 1956-02-15 (66 y.o. [name], [name] Primary Care Hennie Gosa: [name] Other Clinician: Referring Gokul Waybright: Treating Cuinn Westerhold/Extender: Drucilla Chalet RT, JENNIFER Weeks in Treatment: 1 Vital Signs Time Taken: 11:02 Temperature (F): 98.9 Pulse (bpm): 75 Respiratory Rate (breaths/min): 18 Blood Pressure (mmHg): 154/82 Reference Range: 80 - 120 mg / dl Electronic Signature(s) Signed: 07/08/2022 12:30:24 PM By: Erenest Blank Entered By: Erenest Blank on 07/08/2022 11:01:46

## 2022-07-08 NOTE — Progress Notes (Signed)
Jon Wells ([id number]) Visit Report for 07/08/2022 Chief Complaint Document Details Patient Name: Date of Service: Jon Wells [name] 07/08/2022 10:45 A M Medical Record Number: [id number] Patient Account Number: [id number] Date of Birth/Sex: Treating RN: January 10, 1956 (66 y.o. Jon Wells, Jon Wells Primary Care Provider: Maryan Wells Other Clinician: Referring Provider: Treating Provider/Extender: Jon Wells RT, JENNIFER Weeks in Treatment: 1 Information Obtained from: Patient Chief Complaint 01/09/18; patient is here for a second opinion with regards to a pressure area on the lower sacrum/coccyx 06/30/2022; patient returns to clinic for again a pressure ulcer on the lower sacrum/coccyx Electronic Signature(s) Signed: 07/08/2022 12:18:31 PM By: Kalman Shan DO Entered By: Kalman Shan on 07/08/2022 11:53:44 -------------------------------------------------------------------------------- Debridement Details Patient Name: Date of Service: [name] [name] 07/08/2022 10:45 A M Medical Record Number: [id number] Patient Account Number: [id number] Date of Birth/Sex: Treating RN: October 31, 1955 (66 y.o. Jon Wells, Jon Wells Primary Care Provider: Maryan Wells Other Clinician: Referring Provider: Treating Provider/Extender: Jon Wells RT, JENNIFER Weeks in Treatment: 1 Debridement Performed for Assessment: Wound #2 Sacrum Performed By: Physician Kalman Shan, DO Debridement Type: Debridement Level of Consciousness (Pre-procedure): Awake and Alert Pre-procedure Verification/Time Out Yes - 11:43 Taken: Start Time: 11:43 Pain Control: Lidocaine T Area Debrided (L x W): otal 4.7 (cm) x 3.5 (cm) = 16.45 (cm) Tissue and other material debrided: Viable, Non-Viable, Slough, Subcutaneous, Skin: Dermis , Skin: Epidermis, Slough Level: Skin/Subcutaneous Tissue Debridement Description: Excisional Instrument: Curette Bleeding: Minimum Hemostasis Achieved:  Pressure End Time: 11:43 Procedural Pain: 0 Post Procedural Pain: 0 Response to Treatment: Procedure was tolerated well Level of Consciousness (Post- Awake and Alert procedure): Post Debridement Measurements of Total Wound Length: (cm) 4.7 Stage: Category/Stage III Width: (cm) 3.5 Depth: (cm) 0.3 Volume: (cm) 3.876 Character of Wound/Ulcer Post Debridement: Improved Post Procedure Diagnosis Same as Pre-procedure Electronic Signature(s) Signed: 07/08/2022 12:18:31 PM By: Kalman Shan DO Signed: 07/08/2022 12:30:24 PM By: Erenest Blank Signed: 07/08/2022 12:35:09 PM By: Rhae Hammock RN Entered By: Erenest Blank on 07/08/2022 11:44:21 -------------------------------------------------------------------------------- HPI Details Patient Name: Date of Service: [name] [name] 07/08/2022 10:45 A M Medical Record Number: [id number] Patient Account Number: [id number] Date of Birth/Sex: Treating RN: [birth year] (65 y.o. 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 He is an active man has a wheelchair with a Roho cushion. Still active around the home. He is been battling an area on the lower sacrum/coccyx for about a year and according to him is been at the [facility] wound care center for 6-7 months. He didn't want [place] to call for records as he was largely here just for a second opinion and therefore I'm not completely certain what is been used to treat this wound. He is currently using calcium alginate. Has used Xeroform and apparently Prisma at least he appeared to recognize Prisma. He doesn't really have much in the way of additional medical issues. He is hypertensive been only  medication is lisinopril. He is not a diabetic. He does digital stimulation for bowel movements and in and out cath for voiding. 01/23/18; the patient arrives having cut a hole out of an old cushion for his wheelchair. This offloaded the area in question. He is also been using  Endoform to the actual wounds. He arrives today with the wounds less than half the size of last time. Really dramatic surprising improvement All capital readmission 06/30/2022 This is a 66 year old man who is now greater than 20 years post traumatic T12 lesion with resultant paralysis. We had them for 2 visits in 2019 for a wound in the same area I do not think he was discharged in a healed state. He tells me that the wound has varied in condition from time to time over the years most of the time and will gradually improve on its own however over the last 3 months or so he has developed the largest wound he has had and it just will not progress towards healing. He has been going to the wound care center in [place] and of course I have none of these notes. They apparently have had trouble getting a dressing to stay on in the area. In his wheelchair he has a Roho cushion with the coccyx area cut out. He tells me he is rigorous about offloading this in bed. Recently they cultured staph there and he has just completed antibiotics although were not sure which ones.He tells me that they could not get any dressing to adhere to the area. He is also concerned about continuous drainage Past medical history hypertension, T12 level paralysis. He has a neurogenic bladder and does in and out caths. His bowel movements via digital stimulation 9/22; patient presents for follow-up. He had a PCR culture done at last clinic visit that grew normal high levels of Enterococcus bacillus and coag negative staph. Keystone antibiotic ointment was ordered. Patient has not received this yet. He has been using silver alginate dressings. He has no issues  or complaints today. Electronic Signature(s) Signed: 07/08/2022 12:18:31 PM By: Kalman Shan DO Entered By: Kalman Shan on 07/08/2022 11:54:30 -------------------------------------------------------------------------------- Physical Exam Details Patient Name: Date of Service: Jon Wells [name] 07/08/2022 10:45 A M Medical Record Number: [id number] Patient Account Number: [id number] Date of Birth/Sex: Treating RN: 11-Mar-1956 (66 y.o. Jon Wells, Jon Wells Primary Care Provider: Maryan Wells Other Clinician: Referring Provider: Treating Provider/Extender: Jon Wells RT, JENNIFER Weeks in Treatment: 1 Constitutional respirations regular, non-labored and within target range for patient.Marland Kitchen Psychiatric pleasant and cooperative. Notes T the lower part of the coccyx there is an open wound with rolled senescent edges with granulation tissue at the base and slough. No signs of surrounding o infection. Electronic Signature(s) Signed: 07/08/2022 12:18:31 PM By: Kalman Shan DO Entered By: Kalman Shan on 07/08/2022 11:57:11 -------------------------------------------------------------------------------- Physician Orders Details Patient Name: Date of Service: [name] [name] 07/08/2022 10:45 A M Medical Record Number: [id number] Patient Account Number: [id number] Date of Birth/Sex: Treating RN: Mar 16, 1956 (66 y.o. [name] Primary Care Provider: Maryan Wells Other Clinician: Referring Provider: Treating Provider/Extender: Jon Wells RT, JENNIFER Weeks in Treatment: 1 Verbal / Phone Orders: No Diagnosis Coding Follow-up Appointments ppointment in 2 weeks. - Thurs. w/ Dr. Heber [place] and Elias Else # 9 Return A Bathing/ Shower/ Hygiene May shower with protection but do not get wound dressing(s) wet. Off-Loading Turn and reposition every 2 hours Wound Treatment Wound #2 - Sacrum Cleanser: Soap and Water 2 x Per Day/30 Days Discharge  Instructions: May shower and wash wound with dial antibacterial soap and water prior to dressing change. Cleanser: Wound Cleanser (DME) (Generic) 2 x Per Day/30 Days Discharge Instructions: Cleanse the wound with wound cleanser prior to applying a clean dressing using gauze sponges,  not tissue or cotton balls. Peri-Wound Care: Skin Prep (DME) (Generic) 2 x Per Day/30 Days Discharge Instructions: Use skin prep as directed Prim Dressing: Cutimed Sorbact Swab (DME) (Generic) 2 x Per Day/30 Days ary Discharge Instructions: Apply to wound bed as instructed Prim Dressing: AquacelAg Advantage Dressing, 4x5 (in/in) (DME) (Generic) 2 x Per Day/30 Days ary Secondary Dressing: ABD Pad, 5x9 (DME) (Generic) 2 x Per Day/30 Days Discharge Instructions: Apply over primary dressing as directed. Secondary Dressing: Woven Gauze Sponge, Non-Sterile 4x4 in (DME) (Generic) 2 x Per Day/30 Days Discharge Instructions: Apply over primary dressing as directed. Secured With: 13M Medipore H Soft Cloth Surgical T ape, 4 x 10 (in/yd) (DME) (Generic) 2 x Per Day/30 Days Discharge Instructions: Secure with tape as directed. Electronic Signature(s) Signed: 07/08/2022 12:18:31 PM By: Kalman Shan DO Signed: 07/08/2022 12:35:09 PM By: Rhae Hammock RN Entered By: Rhae Hammock on 07/08/2022 12:06:42 -------------------------------------------------------------------------------- Problem List Details Patient Name: Date of Service: [name] [name] 07/08/2022 10:45 A M Medical Record Number: [id number] Patient Account Number: [id number] Date of Birth/Sex: Treating RN: 13-Jul-1956 (66 y.o. Jon Wells, Jon Wells Primary Care Provider: Maryan Wells Other Clinician: Referring Provider: Treating Provider/Extender: Jon Wells RT, JENNIFER Weeks in Treatment: 1 Active Problems ICD-10 Encounter Code Description Active Date MDM Diagnosis L89.153 Pressure ulcer of sacral region, stage 3 06/30/2022 No  Yes S24.114D Complete lesion at T11-T12 level of thoracic spinal cord, subsequent encounter 06/30/2022 No Yes Inactive Problems Resolved Problems Electronic Signature(s) Signed: 07/08/2022 12:18:31 PM By: Kalman Shan DO Entered By: Kalman Shan on 07/08/2022 11:52:54 -------------------------------------------------------------------------------- Progress Note Details Patient Name: Date of Service: [name] [name] 07/08/2022 10:45 A M Medical Record Number: [id number] Patient Account Number: [id number] Date of Birth/Sex: Treating RN: May 28, 1956 (66 y.o. 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 He is an active man has a wheelchair with a Roho cushion. Still active around the home. He is been battling an area on the lower sacrum/coccyx for about a year and according to him is been at the [facility] wound care center for 6-7 months. He didn't want [place] to call for records as he was largely here just for a second opinion and therefore I'm not completely certain what is been used to treat this wound. He is currently using calcium alginate. Has used Xeroform and apparently Prisma at least he appeared to recognize Prisma. He doesn't really have much in the way of additional medical issues. He is hypertensive been only medication is lisinopril. He is not a diabetic. He does digital stimulation for bowel movements and in and out cath for  voiding. 01/23/18; the patient arrives having cut a hole out of an old cushion for his wheelchair. This offloaded the area in question. He is also been using Endoform to the actual wounds. He arrives today with the wounds less than half the size of last time. Really dramatic surprising improvement All capital readmission 06/30/2022 This is a 66 year old man who is now greater than 20  years post traumatic T12 lesion with resultant paralysis. We had them for 2 visits in 2019 for a wound in the same area I do not think he was discharged in a healed state. He tells me that the wound has varied in condition from time to time over the years most of the time and will gradually improve on its own however over the last 3 months or so he has developed the largest wound he has had and it just will not progress towards healing. He has been going to the wound care center in [place] and of course I have none of these notes. They apparently have had trouble getting a dressing to stay on in the area. In his wheelchair he has a Roho cushion with the coccyx area cut out. He tells me he is rigorous about offloading this in bed. Recently they cultured staph there and he has just completed antibiotics although were not sure which ones.He tells me that they could not get any dressing to adhere to the area. He is also concerned about continuous drainage Past medical history hypertension, T12 level paralysis. He has a neurogenic bladder and does in and out caths. His bowel movements via digital stimulation 9/22; patient presents for follow-up. He had a PCR culture done at last clinic visit that grew normal high levels of Enterococcus bacillus and coag negative staph. Keystone antibiotic ointment was ordered. Patient has not received this yet. He has been using silver alginate dressings. He has no issues or complaints today. Patient History Information obtained from Patient. Family History Diabetes - Mother, Hypertension -  Mother,Father, Kidney Disease - Mother, No family history of Cancer, Heart Disease, Hereditary Spherocytosis, Lung Disease, Seizures, Stroke, Thyroid Problems, Tuberculosis. Social History Never smoker, Marital Status - Married, Alcohol Use - Rarely, Drug Use - No History, Caffeine Use - Daily - coffee. Medical History Eyes Denies history of Cataracts, Glaucoma, Optic Neuritis Ear/Nose/Mouth/Throat Denies history of Chronic sinus problems/congestion, Middle ear problems Hematologic/Lymphatic Denies history of Anemia, Hemophilia, Human Immunodeficiency Virus, Lymphedema, Sickle Cell Disease Respiratory Denies history of Aspiration, Asthma, Chronic Obstructive Pulmonary Disease (COPD), Pneumothorax, Sleep Apnea, Tuberculosis Cardiovascular Patient has history of Hypertension Denies history of Angina, Arrhythmia, Congestive Heart Failure, Coronary Artery Disease, Deep Vein Thrombosis, Hypotension, Myocardial Infarction, Peripheral Arterial Disease, Peripheral Venous Disease, Phlebitis, Vasculitis Gastrointestinal Denies history of Cirrhosis , Colitis, Crohnoos, Hepatitis A, Hepatitis B, Hepatitis C Endocrine Denies history of Type I Diabetes, Type II Diabetes Genitourinary Denies history of End Stage Renal Disease Immunological Denies history of Lupus Erythematosus, Raynaudoos, Scleroderma Integumentary (Skin) Denies history of History of Burn Musculoskeletal Denies history of Gout, Rheumatoid Arthritis, Osteoarthritis, Osteomyelitis Neurologic Patient has history of Paraplegia - T12 Denies history of Dementia, Neuropathy, Quadriplegia, Seizure Disorder Oncologic Denies history of Received Chemotherapy, Received Radiation Psychiatric Denies history of Anorexia/bulimia, Confinement Anxiety Hospitalization/Surgery History - back surgery. Medical A Surgical History Notes nd Genitourinary self cath Objective Constitutional respirations regular, non-labored and within target  range for patient.. Vitals Time Taken: 11:02 AM, Temperature: 98.9 F, Pulse: 75 bpm, Respiratory Rate: 18 breaths/min, Blood Pressure: 154/82 mmHg. Psychiatric pleasant and cooperative. General Notes: T the lower part of the coccyx there is an open wound with rolled senescent edges with granulation tissue at the base and slough. No signs of o surrounding infection. Integumentary (Hair, Skin) Wound #2 status is Open. Original cause of wound was Pressure Injury. The date acquired was: 03/17/2022. The wound has been in treatment 1 weeks. The wound is located on the  Sacrum. The wound measures 4.7cm length x 3.5cm width x 0.3cm depth; 12.92cm^2 area and 3.876cm^3 volume. There is Fat Layer (Subcutaneous Tissue) exposed. There is no tunneling or undermining noted. There is a medium amount of serosanguineous drainage noted. The wound margin is distinct with the outline attached to the wound base. There is large (67-100%) red, pink granulation within the wound bed. There is no necrotic tissue within the wound bed. Assessment Active Problems ICD-10 Pressure ulcer of sacral region, stage 3 Complete lesion at T11-T12 level of thoracic spinal cord, subsequent encounter Patient's wound is stable. I debrided nonviable tissue. Patient has no evidence of surrounding soft tissue infection on exam. He had a PCR culture done at last clinic visit with the intention of obtaining Keystone antibiotic ointment. This has been ordered based on results. He can start this when it arrives. For now I recommended continuing with silver alginate and Sorbact. Continue aggressive offloading. Follow-up in 2 weeks. Procedures Wound #2 Pre-procedure diagnosis of Wound #2 is a Pressure Ulcer located on the Sacrum . There was a Excisional Skin/Subcutaneous Tissue Debridement with a total area of 16.45 sq cm performed by Kalman Shan, DO. With the following instrument(s): Curette to remove Viable and Non-Viable tissue/material.  Material removed includes Subcutaneous Tissue, Slough, Skin: Dermis, and Skin: Epidermis after achieving pain control using Lidocaine. No specimens were taken. A time out was conducted at 11:43, prior to the start of the procedure. A Minimum amount of bleeding was controlled with Pressure. The procedure was tolerated well with a pain level of 0 throughout and a pain level of 0 following the procedure. Post Debridement Measurements: 4.7cm length x 3.5cm width x 0.3cm depth; 3.876cm^3 volume. Post debridement Stage noted as Category/Stage III. Character of Wound/Ulcer Post Debridement is improved. Post procedure Diagnosis Wound #2: Same as Pre-Procedure Plan Follow-up Appointments: Return Appointment in 2 weeks. - Thurs. w/ Dr. Heber [place] and Elias Else # 9 Bathing/ Shower/ Hygiene: May shower with protection but do not get wound dressing(s) wet. Off-Loading: Turn and reposition every 2 hours WOUND #2: - Sacrum Wound Laterality: Cleanser: Soap and Water 2 x Per Day/30 Days Discharge Instructions: May shower and wash wound with dial antibacterial soap and water prior to dressing change. Cleanser: Wound Cleanser (DME) (Generic) 2 x Per Day/30 Days Discharge Instructions: Cleanse the wound with wound cleanser prior to applying a clean dressing using gauze sponges, not tissue or cotton balls. Peri-Wound Care: Skin Prep (DME) (Generic) 2 x Per Day/30 Days Discharge Instructions: Use skin prep as directed Prim Dressing: KerraCel Ag Gelling Fiber Dressing, 4x5 in (silver alginate) (DME) (Generic) 2 x Per Day/30 Days ary Discharge Instructions: Apply silver alginate to wound bed as instructed Prim Dressing: Cutimed Sorbact Swab (DME) (Generic) 2 x Per Day/30 Days ary Discharge Instructions: Apply to wound bed as instructed Secondary Dressing: ABD Pad, 5x9 (DME) (Generic) 2 x Per Day/30 Days Discharge Instructions: Apply over primary dressing as directed. Secondary Dressing: Woven Gauze Sponge,  Non-Sterile 4x4 in (DME) (Generic) 2 x Per Day/30 Days Discharge Instructions: Apply over primary dressing as directed. Secured With: 74M Medipore H Soft Cloth Surgical T ape, 4 x 10 (in/yd) (DME) (Generic) 2 x Per Day/30 Days Discharge Instructions: Secure with tape as directed. 1. In office sharp debridement 2. Sorbact and silver alginate 3. Keystone antibiotic ointment 4. Follow-up in 2 weeks Electronic Signature(s) Signed: 07/08/2022 12:18:31 PM By: Kalman Shan DO Entered By: Kalman Shan on 07/08/2022 12:00:46 -------------------------------------------------------------------------------- HxROS Details Patient Name: Date of Service: MO  SS, JA [name] 07/08/2022 10:45 A M Medical Record Number: [id number] Patient Account Number: [id number] Date of Birth/Sex: Treating RN: [birth year] (66 y.o. [name] Primary Care Provider: Maryan Wells Other Clinician: Referring Provider: Treating Provider/Extender: Jon Wells RT, JENNIFER Weeks in Treatment: 1 Information Obtained From Patient Eyes Medical History: Negative for: Cataracts; Glaucoma; Optic Neuritis Ear/Nose/Mouth/Throat Medical History: Negative for: Chronic sinus problems/congestion; Middle ear problems Hematologic/Lymphatic Medical History: Negative for: Anemia; Hemophilia; Human Immunodeficiency Virus; Lymphedema; Sickle Cell Disease Respiratory Medical History: Negative for: Aspiration; Asthma; Chronic Obstructive Pulmonary Disease (COPD); Pneumothorax; Sleep Apnea; Tuberculosis Cardiovascular Medical History: Positive for: Hypertension Negative for: Angina; Arrhythmia; Congestive Heart Failure; Coronary Artery Disease; Deep Vein Thrombosis; Hypotension; Myocardial Infarction; Peripheral Arterial Disease; Peripheral Venous Disease; Phlebitis; Vasculitis Gastrointestinal Medical History: Negative for: Cirrhosis ; Colitis; Crohns; Hepatitis A; Hepatitis B; Hepatitis  C Endocrine Medical History: Negative for: Type I Diabetes; Type II Diabetes Genitourinary Medical History: Negative for: End Stage Renal Disease Past Medical History Notes: self cath Immunological Medical History: Negative for: Lupus Erythematosus; Raynauds; Scleroderma Integumentary (Skin) Medical History: Negative for: History of Burn Musculoskeletal Medical History: Negative for: Gout; Rheumatoid Arthritis; Osteoarthritis; Osteomyelitis Neurologic Medical History: Positive for: Paraplegia - T12 Negative for: Dementia; Neuropathy; Quadriplegia; Seizure Disorder Oncologic Medical History: Negative for: Received Chemotherapy; Received Radiation Psychiatric Medical History: Negative for: Anorexia/bulimia; Confinement Anxiety Immunizations Pneumococcal Vaccine: Received Pneumococcal Vaccination: No Implantable Devices No devices added Hospitalization / Surgery History Type of Hospitalization/Surgery back surgery Family and Social History Cancer: No; Diabetes: Yes - Mother; Heart Disease: No; Hereditary Spherocytosis: No; Hypertension: Yes - Mother,Father; Kidney Disease: Yes - Mother; Lung Disease: No; Seizures: No; Stroke: No; Thyroid Problems: No; Tuberculosis: No; Never smoker; Marital Status - Married; Alcohol Use: Rarely; Drug Use: No History; Caffeine Use: Daily - coffee; Financial Concerns: No; Food, Clothing or Shelter Needs: No; Support System Lacking: No; Transportation Concerns: No Electronic Signature(s) Signed: 07/08/2022 12:18:31 PM By: Kalman Shan DO Signed: 07/08/2022 12:35:09 PM By: Rhae Hammock RN Entered By: Kalman Shan on 07/08/2022 11:55:33 -------------------------------------------------------------------------------- SuperBill Details Patient Name: Date of Service: Jon Wells [name] 07/08/2022 Medical Record Number: [id number] Patient Account Number: [id number] Date of Birth/Sex: Treating RN: 07-Mar-1956 (66 y.o. Jon Wells,  Jon Wells Primary Care Provider: Maryan Wells Other Clinician: Referring Provider: Treating Provider/Extender: Jon Wells RT, JENNIFER Weeks in Treatment: 1 Diagnosis Coding ICD-10 Codes Code Description [phone] Pressure ulcer of sacral region, stage 3 S24.114D Complete lesion at T11-T12 level of thoracic spinal cord, subsequent encounter Facility Procedures CPT4 Code: [id number] Description: 84696 - DEB SUBQ TISSUE 20 SQ CM/< ICD-10 Diagnosis Description L89.153 Pressure ulcer of sacral region, stage 3 Modifier: Quantity: 1 Physician Procedures : CPT4 Code Description Modifier [id number] - WC PHYS SUBQ TISS 20 SQ CM ICD-10 Diagnosis Description L89.153 Pressure ulcer of sacral region, stage 3 Quantity: 1 Electronic Signature(s) Signed: 07/08/2022 12:18:31 PM By: Kalman Shan DO Entered By: Kalman Shan on 07/08/2022 12:00:53

## 2022-07-22 ENCOUNTER — Encounter (HOSPITAL_BASED_OUTPATIENT_CLINIC_OR_DEPARTMENT_OTHER): Payer: Medicare HMO | Attending: Internal Medicine | Admitting: Internal Medicine

## 2022-07-22 DIAGNOSIS — L89153 Pressure ulcer of sacral region, stage 3: Secondary | ICD-10-CM | POA: Diagnosis present

## 2022-07-22 DIAGNOSIS — G8220 Paraplegia, unspecified: Secondary | ICD-10-CM | POA: Diagnosis not present

## 2022-07-28 NOTE — Progress Notes (Signed)
DILLIAN, FEIG ([id number]) [id number] Page 1 of 9 Visit Report for 07/22/2022 Chief Complaint Document Details Patient Name: Date of Service: Jon Wells [name] 07/22/2022 11:30 A M Medical Record Number: [id number] Patient Account Number: [id number] Date of Birth/Sex: Treating RN: 04-Apr-1956 (66 y.o. [name], [name] Primary Care Provider: [name] Other Clinician: Referring Provider: Treating Provider/Extender: Drucilla Chalet RT, JENNIFER Weeks in Treatment: 3 Information Obtained from: Patient Chief Complaint 01/09/18; patient is here for a second opinion with regards to a pressure area on the lower sacrum/coccyx 06/30/2022; patient returns to clinic for again a pressure ulcer on the lower sacrum/coccyx Electronic Signature(s) Signed: 07/22/2022 12:14:24 PM By: Kalman Shan DO Entered By: Kalman Shan on 07/22/2022 12:10:39 -------------------------------------------------------------------------------- Debridement Details Patient Name: Date of Service: [name] [name] 07/22/2022 11:30 A M Medical Record Number: [id number] Patient Account Number: [id number] Date of Birth/Sex: Treating RN: 06/14/1956 (66 y.o. [name], [name] Primary Care Provider: [name] Other Clinician: Referring Provider: Treating Provider/Extender: Drucilla Chalet RT, JENNIFER Weeks in Treatment: 3 Debridement Performed for Assessment: Wound #2 Sacrum Performed By: Physician Kalman Shan, DO Debridement Type: Debridement Level of Consciousness (Pre-procedure): Awake and Alert Pre-procedure Verification/Time Out Yes - 11:45 Taken: Start Time: 11:45 Pain Control: Lidocaine T Area Debrided (L x W): otal 4 (cm) x 2 (cm) = 8 (cm) Tissue and other material debrided: Viable, Non-Viable, Slough, Subcutaneous, Slough Level: Skin/Subcutaneous Tissue Debridement Description: Excisional Instrument: Curette Bleeding: Minimum Hemostasis  Achieved: Pressure End Time: 11:45 Procedural Pain: 0 Post Procedural Pain: 0 Response to Treatment: Procedure was tolerated well Level of Consciousness (Post- Awake and Alert procedure): Post Debridement Measurements of Total Wound Length: (cm) 4 Stage: Category/Stage III Width: (cm) 2 Depth: (cm) 0.3 Volume: (cm) 1.885 Character of Wound/Ulcer Post Debridement: Improved Jon Wells, Jon Wells ([id number]) [id number] Page 2 of 9 Post Procedure Diagnosis Same as Pre-procedure Electronic Signature(s) Signed: 07/22/2022 11:59:37 AM By: Rhae Hammock RN Signed: 07/22/2022 12:14:24 PM By: Kalman Shan DO Entered By: Rhae Hammock on 07/22/2022 11:49:24 -------------------------------------------------------------------------------- HPI Details Patient Name: Date of Service: [name] [name] 07/22/2022 11:30 A M Medical Record Number: [id number] Patient Account Number: [id number] Date of Birth/Sex: Treating RN: 06-Mar-1956 (66 y.o. 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 He is an active man has a wheelchair with a Roho cushion. Still active around the home. He is been battling an area on the lower sacrum/coccyx for about a year and according to him is been at the [facility] wound care center for 6-7 months. He didn't want [place] to call for records as he was largely here just for a second opinion and therefore I'm not completely certain what is been used to treat this wound. He is currently using calcium alginate. Has used Xeroform and apparently Prisma at least he appeared to recognize Prisma. He doesn't really have much in the way of additional medical  issues. He is hypertensive been only medication is lisinopril. He is not a diabetic. He does digital stimulation for bowel movements and in and out cath for voiding. 01/23/18; the patient arrives having cut a hole out of an old cushion for his wheelchair. This offloaded the area in question. He is also  been using Endoform to the actual wounds. He arrives today with the wounds less than half the size of last time. Really dramatic surprising improvement All capital readmission 06/30/2022 This is a 66 year old man who is now greater than 20 years post traumatic T12 lesion with resultant paralysis. We had them for 2 visits in 2019 for a wound in the same area I do not think he was discharged in a healed state. He tells me that the wound has varied in condition from time to time over the years most of the time and will gradually improve on its own however over the last 3 months or so he has developed the largest wound he has had and it just will not progress towards healing. He has been going to the wound care center in [place] and of course I have none of these notes. They apparently have had trouble getting a dressing to stay on in the area. In his wheelchair he has a Roho cushion with the coccyx area cut out. He tells me he is rigorous about offloading this in bed. Recently they cultured staph there and he has just completed antibiotics although were not sure which ones.He tells me that they could not get any dressing to adhere to the area. He is also concerned about continuous drainage Past medical history hypertension, T12 level paralysis. He has a neurogenic bladder and does in and out caths. His bowel movements via digital stimulation 9/22; patient presents for follow-up. He had a PCR culture done at last clinic visit that grew normal high levels of Enterococcus bacillus and coag negative staph. Keystone antibiotic ointment was ordered. Patient has not received this yet. He has been using silver  alginate dressings. He has no issues or complaints today. 10/6; patient presents for follow-up. He states he received Keystone antibiotic ointment and has been using this with silver alginate. He has no issues or complaints today. Electronic Signature(s) Signed: 07/22/2022 12:14:24 PM By: Kalman Shan DO Entered By: Kalman Shan on 07/22/2022 12:10:57 -------------------------------------------------------------------------------- Physical Exam Details Patient Name: Date of Service: [name] [name] 07/22/2022 11:30 A M Medical Record Number: [id number] Patient Account Number: [id number] Date of Birth/Sex: Treating RN: 1956-06-29 (66 y.o. [name], [name] Primary Care Provider: [name] Other Clinician: Referring Provider: Treating Provider/Extender: Drucilla Chalet RT, JENNIFER Weeks in Treatment: [address] RANDOL, ZUMSTEIN ([id number]) [id number] Page 3 of 9 Constitutional respirations regular, non-labored and within target range for patient.Marland Kitchen Psychiatric pleasant and cooperative. Notes T the lower part of the coccyx there is an open wound with rolled senescent edges with granulation tissue at the base and slough. No signs of surrounding o infection. Electronic Signature(s) Signed: 07/22/2022 12:14:24 PM By: Kalman Shan DO Entered By: Kalman Shan on 07/22/2022 12:11:21 -------------------------------------------------------------------------------- Physician Orders Details Patient Name: Date of Service: [name] [name] 07/22/2022 11:30 A M Medical Record Number: [id number] Patient Account Number: [id number] Date of Birth/Sex: Treating RN: 1956-08-07 (66 y.o. [name] Primary Care Provider: [name] Other Clinician: Referring Provider: Treating Provider/Extender: Drucilla Chalet RT, JENNIFER Weeks in Treatment: 3 Verbal / Phone Orders: No Diagnosis Coding Follow-up Appointments ppointment in 2  weeks. - Thurs. w/ Dr. Heber [place] and Elias Else # 9 Return A Bathing/ Shower/ Hygiene May shower with protection but do not get wound dressing(s) wet. Off-Loading Turn and reposition every 2 hours Wound Treatment Wound #2 - Sacrum Cleanser: Soap and Water 2 x Per Day/30 Days Discharge Instructions: May shower and wash  wound with dial antibacterial soap and water prior to dressing change. Cleanser: Wound Cleanser (Generic) 2 x Per Day/30 Days Discharge Instructions: Cleanse the wound with wound cleanser prior to applying a clean dressing using gauze sponges, not tissue or cotton balls. Peri-Wound Care: Skin Prep (Generic) 2 x Per Day/30 Days Discharge Instructions: Use skin prep as directed Topical: Keystone 2 x Per Day/30 Days Prim Dressing: AquacelAg Advantage Dressing, 4x5 (in/in) (Generic) 2 x Per Day/30 Days ary Secondary Dressing: Woven Gauze Sponge, Non-Sterile 4x4 in (Generic) 2 x Per Day/30 Days Discharge Instructions: Apply over primary dressing as directed. Secondary Dressing: Zetuvit Plus Silicone Border Dressing 5x5 (in/in) (DME) (Generic) 2 x Per Day/30 Days Discharge Instructions: Apply silicone border over primary dressing as directed. Secured With: 39M Medipore H Soft Cloth Surgical T ape, 4 x 10 (in/yd) (Generic) 2 x Per Day/30 Days Discharge Instructions: Secure with tape as directed. Electronic Signature(s) Signed: 07/22/2022 12:14:24 PM By: Kalman Shan DO Previous Signature: 07/22/2022 11:59:37 AM Version By: Rhae Hammock RN Entered By: Kalman Shan on 07/22/2022 12:11:29 LLEWYN, HEAP ([id number]) [id number] Page 4 of 9 -------------------------------------------------------------------------------- Problem List Details Patient Name: Date of Service: Jon Wells [name] 07/22/2022 11:30 A M Medical Record Number: [id number] Patient Account Number: [id number] Date of Birth/Sex: Treating RN: 1956/09/12 (66 y.o. [name],  [name] Primary Care Provider: [name] Other Clinician: Referring Provider: Treating Provider/Extender: Drucilla Chalet RT, JENNIFER Weeks in Treatment: 3 Active Problems ICD-10 Encounter Code Description Active Date MDM Diagnosis L89.153 Pressure ulcer of sacral region, stage 3 06/30/2022 No Yes S24.114D Complete lesion at T11-T12 level of thoracic spinal cord, subsequent encounter 06/30/2022 No Yes Inactive Problems Resolved Problems Electronic Signature(s) Signed: 07/22/2022 12:14:24 PM By: Kalman Shan DO Entered By: Kalman Shan on 07/22/2022 12:10:27 -------------------------------------------------------------------------------- Progress Note Details Patient Name: Date of Service: [name] [name] 07/22/2022 11:30 A M Medical Record Number: [id number] Patient Account Number: [id number] Date of Birth/Sex: Treating RN: 08-04-1956 (66 y.o. 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 He is an active man has a wheelchair with a Roho cushion. Still active around the home. He is been battling an area on the lower sacrum/coccyx for about a year and according to him is been at the [facility] wound care center for 6-7 months. He didn't want [place] to call for records as he was largely here just for a second opinion and therefore I'm not completely certain what is been used to treat this wound. He is currently using  calcium alginate. Has used Xeroform and apparently Prisma at least he appeared to recognize Prisma. He doesn't really have much in the way of additional medical issues. He is hypertensive been only medication is lisinopril. He is not a diabetic. He does digital stimulation for bowel movements and in and out cath for voiding. 01/23/18; the patient arrives having cut a hole out of an old cushion for his wheelchair. This offloaded the area in question. He is also been using Endoform to  the actual wounds. He arrives today with the wounds less than half the size of last time. Really dramatic surprising improvement All capital readmission 06/30/2022 This is a 66 year old man who is now greater than 20 years post traumatic T12 lesion with resultant paralysis. We had them for 2 visits in 2019 for a wound in the same area I do not think he was discharged in a healed state. He tells me that the wound has varied in condition from time to time over the years most of FONG, MCCARRY ([id number]) [id number] Page 5 of 9 the time and will gradually improve on its own however over the last 3 months or so he has developed the largest wound he has had and it just will not progress towards healing. He has been going to the wound care center in [place] and of course I have none of these notes. They apparently have had trouble getting a dressing to stay on in the area. In his wheelchair he has a Roho cushion with the coccyx area cut out. He tells me he is rigorous about offloading this in bed. Recently they cultured staph there and he has just completed antibiotics although were not sure which ones.He tells me that they could not get any dressing to adhere to the area. He is also concerned about continuous drainage Past medical history hypertension, T12 level paralysis. He has a neurogenic bladder and does in and out caths. His bowel movements via digital stimulation 9/22; patient presents for  follow-up. He had a PCR culture done at last clinic visit that grew normal high levels of Enterococcus bacillus and coag negative staph. Keystone antibiotic ointment was ordered. Patient has not received this yet. He has been using silver alginate dressings. He has no issues or complaints today. 10/6; patient presents for follow-up. He states he received Keystone antibiotic ointment and has been using this with silver alginate. He has no issues or complaints today. Patient History Information obtained from Patient. Family History Diabetes - Mother, Hypertension - Mother,Father, Kidney Disease - Mother, No family history of Cancer, Heart Disease, Hereditary Spherocytosis, Lung Disease, Seizures, Stroke, Thyroid Problems, Tuberculosis. Social History Never smoker, Marital Status - Married, Alcohol Use - Rarely, Drug Use - No History, Caffeine Use - Daily - coffee. Medical History Eyes Denies history of Cataracts, Glaucoma, Optic Neuritis Ear/Nose/Mouth/Throat Denies history of Chronic sinus problems/congestion, Middle ear problems Hematologic/Lymphatic Denies history of Anemia, Hemophilia, Human Immunodeficiency Virus, Lymphedema, Sickle Cell Disease Respiratory Denies history of Aspiration, Asthma, Chronic Obstructive Pulmonary Disease (COPD), Pneumothorax, Sleep Apnea, Tuberculosis Cardiovascular Patient has history of Hypertension Denies history of Angina, Arrhythmia, Congestive Heart Failure, Coronary Artery Disease, Deep Vein Thrombosis, Hypotension, Myocardial Infarction, Peripheral Arterial Disease, Peripheral Venous Disease, Phlebitis, Vasculitis Gastrointestinal Denies history of Cirrhosis , Colitis, Crohnoos, Hepatitis A, Hepatitis B, Hepatitis C Endocrine Denies history of Type I Diabetes, Type II Diabetes Genitourinary Denies history of End Stage Renal Disease Immunological Denies history of Lupus Erythematosus, Raynaudoos, Scleroderma Integumentary (Skin) Denies history  of History of Burn Musculoskeletal Denies history of Gout, Rheumatoid Arthritis, Osteoarthritis, Osteomyelitis Neurologic Patient has history of Paraplegia - T12 Denies history of Dementia, Neuropathy, Quadriplegia, Seizure Disorder Oncologic Denies history of Received Chemotherapy, Received Radiation Psychiatric Denies history of Anorexia/bulimia, Confinement Anxiety Hospitalization/Surgery History - back surgery. Medical A Surgical History Notes nd Genitourinary self cath Objective Constitutional respirations regular, non-labored and within target range for patient.. Vitals Time Taken: 11:29 AM, Temperature: 98.7 F, Pulse: 77 bpm, Respiratory Rate: 18 breaths/min, Blood Pressure:  171/83 mmHg. Psychiatric pleasant and cooperative. General Notes: T the lower part of the coccyx there is an open wound with rolled senescent edges with granulation tissue at the base and slough. No signs of o surrounding infection. Integumentary (Hair, Skin) BRYDEN, DARDEN ([id number]) [id number] Page 6 of 9 Wound #2 status is Open. Original cause of wound was Pressure Injury. The date acquired was: 03/17/2022. The wound has been in treatment 3 weeks. The wound is located on the Sacrum. The wound measures 4cm length x 2cm width x 0.3cm depth; 6.283cm^2 area and 1.885cm^3 volume. There is Fat Layer (Subcutaneous Tissue) exposed. There is a medium amount of serosanguineous drainage noted. The wound margin is distinct with the outline attached to the wound base. There is large (67-100%) red, pink granulation within the wound bed. There is no necrotic tissue within the wound bed. The periwound skin appearance did not exhibit: Callus, Crepitus, Excoriation, Induration, Rash, Scarring, Dry/Scaly, Maceration, Atrophie Blanche, Cyanosis, Ecchymosis, Hemosiderin Staining, Mottled, Pallor, Rubor, Erythema. Periwound temperature was noted as No Abnormality. Assessment Active  Problems ICD-10 Pressure ulcer of sacral region, stage 3 Complete lesion at T11-T12 level of thoracic spinal cord, subsequent encounter Patient's wound has shown improvement in size) since last clinic visit. I debrided nonviable tissue. I recommended continuing the course with [facility] antibiotic and silver alginate. Aggressive offloading. Follow-up in 2 weeks. Procedures Wound #2 Pre-procedure diagnosis of Wound #2 is a Pressure Ulcer located on the Sacrum . There was a Excisional Skin/Subcutaneous Tissue Debridement with a total area of 8 sq cm performed by Kalman Shan, DO. With the following instrument(s): Curette to remove Viable and Non-Viable tissue/material. Material removed includes Subcutaneous Tissue and Slough and after achieving pain control using Lidocaine. No specimens were taken. A time out was conducted at 11:45, prior to the start of the procedure. A Minimum amount of bleeding was controlled with Pressure. The procedure was tolerated well with a pain level of 0 throughout and a pain level of 0 following the procedure. Post Debridement Measurements: 4cm length x 2cm width x 0.3cm depth; 1.885cm^3 volume. Post debridement Stage noted as Category/Stage III. Character of Wound/Ulcer Post Debridement is improved. Post procedure Diagnosis Wound #2: Same as Pre-Procedure Plan Follow-up Appointments: Return Appointment in 2 weeks. - Thurs. w/ Dr. Heber [place] and Elias Else # 9 Bathing/ Shower/ Hygiene: May shower with protection but do not get wound dressing(s) wet. Off-Loading: Turn and reposition every 2 hours WOUND #2: - Sacrum Wound Laterality: Cleanser: Soap and Water 2 x Per Day/30 Days Discharge Instructions: May shower and wash wound with dial antibacterial soap and water prior to dressing change. Cleanser: Wound Cleanser (Generic) 2 x Per Day/30 Days Discharge Instructions: Cleanse the wound with wound cleanser prior to applying a clean dressing using gauze sponges, not  tissue or cotton balls. Peri-Wound Care: Skin Prep (Generic) 2 x Per Day/30 Days Discharge Instructions: Use skin prep as directed Topical: Keystone 2 x Per Day/30 Days Prim Dressing: AquacelAg Advantage Dressing, 4x5 (in/in) (Generic) 2 x Per Day/30 Days ary Secondary Dressing: Woven Gauze Sponge, Non-Sterile 4x4 in (Generic) 2 x Per Day/30 Days Discharge Instructions: Apply over primary dressing as directed. Secondary Dressing: Zetuvit Plus Silicone Border Dressing 5x5 (in/in) (DME) (Generic) 2 x Per Day/30 Days Discharge Instructions: Apply silicone border over primary dressing as directed. Secured With: 9M Medipore H Soft Cloth Surgical T ape, 4 x 10 (in/yd) (Generic) 2 x Per Day/30 Days Discharge Instructions: Secure with tape as directed. 1. In office sharp debridement  2. Keystone antibiotic with silver alginate 3. Aggressive offloading 4. Follow-up in 2 weeks Electronic Signature(s) Signed: 07/22/2022 12:14:24 PM By: Kalman Shan DO Entered By: Kalman Shan on 07/22/2022 12:12:05 Jon Wells, Jon Wells ([id number]) [id number] Page 7 of 9 -------------------------------------------------------------------------------- HxROS Details Patient Name: Date of Service: [name] [name] 07/22/2022 11:30 A M Medical Record Number: [id number] Patient Account Number: [id number] Date of Birth/Sex: Treating RN: 08/10/1956 (66 y.o. [name] Primary Care Provider: [name] Other Clinician: Referring Provider: Treating Provider/Extender: Drucilla Chalet RT, JENNIFER Weeks in Treatment: 3 Information Obtained From Patient Eyes Medical History: Negative for: Cataracts; Glaucoma; Optic Neuritis Ear/Nose/Mouth/Throat Medical History: Negative for: Chronic sinus problems/congestion; Middle ear problems Hematologic/Lymphatic Medical History: Negative for: Anemia; Hemophilia; Human Immunodeficiency Virus; Lymphedema; Sickle Cell  Disease Respiratory Medical History: Negative for: Aspiration; Asthma; Chronic Obstructive Pulmonary Disease (COPD); Pneumothorax; Sleep Apnea; Tuberculosis Cardiovascular Medical History: Positive for: Hypertension Negative for: Angina; Arrhythmia; Congestive Heart Failure; Coronary Artery Disease; Deep Vein Thrombosis; Hypotension; Myocardial Infarction; Peripheral Arterial Disease; Peripheral Venous Disease; Phlebitis; Vasculitis Gastrointestinal Medical History: Negative for: Cirrhosis ; Colitis; Crohns; Hepatitis A; Hepatitis B; Hepatitis C Endocrine Medical History: Negative for: Type I Diabetes; Type II Diabetes Genitourinary Medical History: Negative for: End Stage Renal Disease Past Medical History Notes: self cath Immunological Medical History: Negative for: Lupus Erythematosus; Raynauds; Scleroderma Integumentary (Skin) Medical History: Negative for: History of Burn Musculoskeletal Medical History: Negative for: Gout; Rheumatoid Arthritis; Osteoarthritis; Osteomyelitis JARON, CZARNECKI ([id number]) [id number] Page 8 of 9 Neurologic Medical History: Positive for: Paraplegia - T12 Negative for: Dementia; Neuropathy; Quadriplegia; Seizure Disorder Oncologic Medical History: Negative for: Received Chemotherapy; Received Radiation Psychiatric Medical History: Negative for: Anorexia/bulimia; Confinement Anxiety Immunizations Pneumococcal Vaccine: Received Pneumococcal Vaccination: No Implantable Devices No devices added Hospitalization / Surgery History Type of Hospitalization/Surgery back surgery Family and Social History Cancer: No; Diabetes: Yes - Mother; Heart Disease: No; Hereditary Spherocytosis: No; Hypertension: Yes - Mother,Father; Kidney Disease: Yes - Mother; Lung Disease: No; Seizures: No; Stroke: No; Thyroid Problems: No; Tuberculosis: No; Never smoker; Marital Status - Married; Alcohol Use: Rarely; Drug Use: No History;  Caffeine Use: Daily - coffee; Financial Concerns: No; Food, Clothing or Shelter Needs: No; Support System Lacking: No; Transportation Concerns: No Electronic Signature(s) Signed: 07/22/2022 12:14:24 PM By: Kalman Shan DO Signed: 07/28/2022 4:29:25 PM By: Rhae Hammock RN Entered By: Kalman Shan on 07/22/2022 12:11:02 -------------------------------------------------------------------------------- SuperBill Details Patient Name: Date of Service: [name] [name] 07/22/2022 Medical Record Number: [id number] Patient Account Number: [id number] Date of Birth/Sex: Treating RN: 1956-04-04 (66 y.o. [name], [name] Primary Care Provider: [name] Other Clinician: Referring Provider: Treating Provider/Extender: Drucilla Chalet RT, JENNIFER Weeks in Treatment: 3 Diagnosis Coding ICD-10 Codes Code Description 4063204336 Pressure ulcer of sacral region, stage 3 S24.114D Complete lesion at T11-T12 level of thoracic spinal cord, subsequent encounter Facility Procedures : CPT4 Code: [id number] Description: 11042 - DEB SUBQ TISSUE 20 SQ CM/< ICD-10 Diagnosis Description L89.153 Pressure ulcer of sacral region, stage 3 Modifier: Quantity: 1 Physician Procedures : CPT4 Code Description Modifier [id number] - WC PHYS SUBQ TISS 20 SQ CM REEGAN, BOUFFARD ([id number]) [id number] ICD-10 Diagnosis Description L89.153 Pressure ulcer of sacral region, stage 3 Quantity: 1 df Page 9 of 9 Electronic Signature(s) Signed: 07/22/2022 12:14:24 PM By: Kalman Shan DO Previous Signature: 07/22/2022 11:59:37 AM Version By: Rhae Hammock RN Entered By: Kalman Shan on 07/22/2022 12:12:13

## 2022-07-28 NOTE — Progress Notes (Addendum)
Jon Wells, Jon Wells ([id number]) [id number] Page 1 of 7 Visit Report for 07/22/2022 Arrival Information Details Patient Name: Date of Service: Jon Dakota [name] 07/22/2022 11:30 A M Medical Record Number: [id number] Patient Account Number: [id number] Date of Birth/Sex: Treating RN: [birth year] (66 y.o. Jon Wells, Jon Wells Primary Care Jon Wells: Jon Wells Other Clinician: Referring Jon Wells: Treating Jon Wells/Extender: Jon Wells in Treatment: 3 Visit Information History Since Last Visit Added or deleted any medications: No Patient Arrived: Wheel Chair Any new allergies or adverse reactions: No Arrival Time: 11:27 Had a fall or experienced change in No Accompanied By: self activities of daily living that may affect Transfer Assistance: None risk of falls: Patient Identification Verified: Yes Signs or symptoms of abuse/neglect since last visito No Secondary Verification Process Completed: Yes Hospitalized since last visit: No Patient Requires Transmission-Based Precautions: No Implantable device outside of the clinic excluding No Patient Has Alerts: No cellular tissue based products placed in the center since last visit: Pain Present Now: No Electronic Signature(s) Signed: 07/22/2022 12:40:19 PM By: Erenest Blank Entered By: Erenest Blank on 07/22/2022 11:28:58 -------------------------------------------------------------------------------- Encounter Discharge Information Details Patient Name: Date of Service: Jon Wells [name] 07/22/2022 11:30 A M Medical Record Number: [id number] Patient Account Number: [id number] Date of Birth/Sex: Treating RN: 1956/10/12 (66 y.o. Jon Wells, Jon Wells Primary Care Jon Wells: Jon Wells Other Clinician: Referring Jon Wells: Treating Jon Wells/Extender: Jon Wells in Treatment: 3 Encounter Discharge Information Items Post Procedure Vitals Discharge  Condition: Stable Temperature (F): 98.7 Ambulatory Status: Wheelchair Pulse (bpm): 74 Discharge Destination: Home Respiratory Rate (breaths/min): 17 Transportation: Private Auto Blood Pressure (mmHg): 120/80 Accompanied By: self Schedule Follow-up Appointment: Yes Clinical Summary of Care: Patient Declined Electronic Signature(s) Signed: 07/22/2022 11:59:37 AM By: Rhae Hammock RN Entered By: Rhae Hammock on 07/22/2022 11:57:56 Jon Wells, Jon Wells ([id number]) [id number] Page 2 of 7 -------------------------------------------------------------------------------- Lower Extremity Assessment Details Patient Name: Date of Service: Jon Wells [name] 07/22/2022 11:30 A M Medical Record Number: [id number] Patient Account Number: [id number] Date of Birth/Sex: Treating RN: Jun 18, 1956 (66 y.o. Jon Wells, Jon Wells Primary Care Juno Bozard: Jon Wells Other Clinician: Referring Jon Wells: Treating Jon Wells/Extender: Jon Wells in Treatment: 3 Electronic Signature(s) Signed: 07/22/2022 11:59:37 AM By: Rhae Hammock RN Signed: 07/22/2022 12:40:19 PM By: Erenest Blank Entered By: Erenest Blank on 07/22/2022 11:38:12 -------------------------------------------------------------------------------- Multi Wound Chart Details Patient Name: Date of Service: Jon Wells [name] 07/22/2022 11:30 A M Medical Record Number: [id number] Patient Account Number: [id number] Date of Birth/Sex: Treating RN: Nov 14, 1955 (66 y.o. Jon Wells, Jon Wells Primary Care Kalon Erhardt: Jon Wells Other Clinician: Referring Jon Wells: Treating Jon Wells/Extender: Jon Wells in Treatment: 3 Vital Signs Height(in): Pulse(bpm): 88 Weight(lbs): Blood Pressure(mmHg): 171/83 Body Mass Index(BMI): Temperature(F): 98.7 Respiratory Rate(breaths/min): 18 [2:Photos:] [N/A:N/A] Sacrum N/A N/A Wound Location: Pressure Injury N/A  N/A Wounding Event: Pressure Ulcer N/A N/A Primary Etiology: Hypertension, Paraplegia N/A N/A Comorbid History: 03/17/2022 N/A N/A Date Acquired: 3 N/A N/A Weeks of Treatment: Open N/A N/A Wound Status: No N/A N/A Wound Recurrence: 4x2x0.3 N/A N/A Measurements L x W x D (cm) 6.283 N/A N/A A (cm) : rea 1.885 N/A N/A Volume (cm) : 41.80% N/A N/A % Reduction in A rea: 41.80% N/A N/A % Reduction in Volume: Category/Stage III N/A N/A Classification: Medium N/A N/A Exudate A mount: Serosanguineous N/A N/A Exudate Type: red, brown N/A N/A Exudate Color: Distinct, outline attached N/A N/A Wound Margin: Large (67-100%) N/A N/A Granulation A mount: Red, Pink N/A N/A Granulation  Quality: None Present (0%) N/A N/A Necrotic A mount: Jon Wells, Jon Wells ([id number]) [id number] Page 3 of 7 Fat Layer (Subcutaneous Tissue): Yes N/A N/A Exposed Structures: Fascia: No Tendon: No Muscle: No Joint: No Bone: No None N/A N/A Epithelialization: Debridement - Excisional N/A N/A Debridement: Pre-procedure Verification/Time Out 11:45 N/A N/A Taken: Lidocaine N/A N/A Pain Control: Subcutaneous, Slough N/A N/A Tissue Debrided: Skin/Subcutaneous Tissue N/A N/A Level: 8 N/A N/A Debridement A (sq cm): rea Curette N/A N/A Instrument: Minimum N/A N/A Bleeding: Pressure N/A N/A Hemostasis A chieved: 0 N/A N/A Procedural Pain: 0 N/A N/A Post Procedural Pain: Procedure was tolerated well N/A N/A Debridement Treatment Response: 4x2x0.3 N/A N/A Post Debridement Measurements L x W x D (cm) 1.885 N/A N/A Post Debridement Volume: (cm) Category/Stage III N/A N/A Post Debridement Stage: Excoriation: No N/A N/A Periwound Skin Texture: Induration: No Callus: No Crepitus: No Rash: No Scarring: No Maceration: No N/A N/A Periwound Skin Moisture: Dry/Scaly: No Atrophie Blanche: No N/A N/A Periwound Skin Color: Cyanosis: No Ecchymosis: No Erythema:  No Hemosiderin Staining: No Mottled: No Pallor: No Rubor: No No Abnormality N/A N/A Temperature: Debridement N/A N/A Procedures Performed: Treatment Notes Wound #2 (Sacrum) Cleanser Soap and Water Discharge Instruction: May shower and wash wound with dial antibacterial soap and water prior to dressing change. Wound Cleanser Discharge Instruction: Cleanse the wound with wound cleanser prior to applying a clean dressing using gauze sponges, not tissue or cotton balls. Peri-Wound Care Skin Prep Discharge Instruction: Use skin prep as directed Topical Keystone Primary Dressing AquacelAg Advantage Dressing, 4x5 (in/in) Secondary Dressing Woven Gauze Sponge, Non-Sterile 4x4 in Discharge Instruction: Apply over primary dressing as directed. Zetuvit Plus Silicone Border Dressing 5x5 (in/in) Discharge Instruction: Apply silicone border over primary dressing as directed. Secured With 45M Medipore H Soft Cloth Surgical T ape, 4 x 10 (in/yd) Discharge Instruction: Secure with tape as directed. Compression Wrap Compression Stockings Add-Ons Electronic Signature(s) Jon Wells, Jon Wells ([id number]) [id number] Page 4 of 7 Signed: 07/22/2022 12:14:24 PM By: Kalman Shan DO Signed: 07/28/2022 4:29:25 PM By: Rhae Hammock RN Entered By: Kalman Shan on 07/22/2022 12:10:33 -------------------------------------------------------------------------------- Multi-Disciplinary Care Plan Details Patient Name: Date of Service: Jon Wells [name] 07/22/2022 11:30 A M Medical Record Number: [id number] Patient Account Number: [id number] Date of Birth/Sex: Treating RN: [birth year] (66 y.o. Jon Wells, Jon Wells Primary Care Jahson Emanuele: Jon Wells Other Clinician: Referring Kimmy Totten: Treating Lamanda Rudder/Extender: Jon Wells in Treatment: 3 Active Inactive Electronic Signature(s) Signed: 09/02/2022 3:40:59 PM By: Deon Pilling RN, BSN Signed:  10/19/2022 5:36:43 PM By: Rhae Hammock RN Previous Signature: 07/22/2022 11:59:37 AM Version By: Rhae Hammock RN Entered By: Deon Pilling on 09/02/2022 15:40:59 -------------------------------------------------------------------------------- Pain Assessment Details Patient Name: Date of Service: Jon Wells [name] 07/22/2022 11:30 A M Medical Record Number: [id number] Patient Account Number: [id number] Date of Birth/Sex: Treating RN: 06-03-1956 (66 y.o. [name] Primary Care Caliegh Middlekauff: Jon Wells Other Clinician: Referring Giuliano Preece: Treating Jarren Para/Extender: Jon Wells in Treatment: 3 Active Problems Location of Pain Severity and Description of Pain Patient Has Paino No Site Locations Pain Management and Medication Current Pain Management: Jon Wells, Jon Wells ([id number]) [id number] Page 5 of 7 Electronic Signature(s) Signed: 07/22/2022 11:59:37 AM By: Rhae Hammock RN Signed: 07/22/2022 12:40:19 PM By: Erenest Blank Entered By: Erenest Blank on 07/22/2022 11:38:03 -------------------------------------------------------------------------------- Patient/Caregiver Education Details Patient Name: Date of Service: Jon Wells [name] 10/6/2023andnbsp11:30 A M Medical Record Number: [id number] Patient Account Number: [id number] Date of Birth/Gender: Treating RN: Dec 29, 1955 (  66 y.o. [name] Primary Care Physician: Jon Wells Other Clinician: Referring Physician: Treating Physician/Extender: Jon Wells in Treatment: 3 Education Assessment Education Provided To: Patient Education Topics Provided Wound/Skin Impairment: Methods: Explain/Verbal Responses: Reinforcements needed, State content correctly Electronic Signature(s) Signed: 07/22/2022 11:59:37 AM By: Rhae Hammock RN Entered By: Rhae Hammock on 07/22/2022  11:45:52 -------------------------------------------------------------------------------- Wound Assessment Details Patient Name: Date of Service: Jon Wells [name] 07/22/2022 11:30 A M Medical Record Number: [id number] Patient Account Number: [id number] Date of Birth/Sex: Treating RN: 07-Jul-1956 (66 y.o. [name] Primary Care Anedra Penafiel: Jon Wells Other Clinician: Referring Datra Clary: Treating Klohe Lovering/Extender: Jon Wells in Treatment: 3 Wound Status Wound Number: 2 Primary Etiology: Pressure Ulcer Wound Location: Sacrum Wound Status: Open Wounding Event: Pressure Injury Comorbid History: Hypertension, Paraplegia Date Acquired: 03/17/2022 Weeks Of Treatment: 3 Clustered Wound: No Photos Jon Wells, Jon Wells ([id number]) [id number] Page 6 of 7 Wound Measurements Length: (cm) 4 Width: (cm) 2 Depth: (cm) 0.3 Area: (cm) 6.283 Volume: (cm) 1.885 % Reduction in Area: 41.8% % Reduction in Volume: 41.8% Epithelialization: None Wound Description Classification: Category/Stage III Wound Margin: Distinct, outline attached Exudate Amount: Medium Exudate Type: Serosanguineous Exudate Color: red, brown Foul Odor After Cleansing: No Slough/Fibrino Yes Wound Bed Granulation Amount: Large (67-100%) Exposed Structure Granulation Quality: Red, Pink Fascia Exposed: No Necrotic Amount: None Present (0%) Fat Layer (Subcutaneous Tissue) Exposed: Yes Tendon Exposed: No Muscle Exposed: No Joint Exposed: No Bone Exposed: No Periwound Skin Texture Texture Color No Abnormalities Noted: No No Abnormalities Noted: No Callus: No Atrophie Blanche: No Crepitus: No Cyanosis: No Excoriation: No Ecchymosis: No Induration: No Erythema: No Rash: No Hemosiderin Staining: No Scarring: No Mottled: No Pallor: No Moisture Rubor: No No Abnormalities Noted: No Dry / Scaly: No Temperature / Pain Maceration: No Temperature: No  Abnormality Electronic Signature(s) Signed: 07/22/2022 11:59:37 AM By: Rhae Hammock RN Signed: 07/22/2022 12:40:19 PM By: Erenest Blank Entered By: Erenest Blank on 07/22/2022 11:35:22 -------------------------------------------------------------------------------- Vitals Details Patient Name: Date of Service: Jon Wells [name] 07/22/2022 11:30 A M Medical Record Number: [id number] Patient Account Number: [id number] Date of Birth/Sex: Treating RN: Jul 29, 1956 (66 y.o. Jon Wells, Jon Wells Primary Care Nykia Turko: Jon Wells Other Clinician: Referring Kamdyn Colborn: Treating Aryiah Monterosso/Extender: Jon Wells in Treatment: 3 [address] Jon Wells, Jon Wells ([id number]) [id number] Page 7 of 7 Time Taken: 11:29 Temperature (F): 98.7 Pulse (bpm): 77 Respiratory Rate (breaths/min): 18 Blood Pressure (mmHg): 171/83 Reference Range: 80 - 120 mg / dl Electronic Signature(s) Signed: 07/22/2022 12:40:19 PM By: Erenest Blank Entered By: Erenest Blank on 07/22/2022 11:30:42

## 2022-08-05 ENCOUNTER — Encounter (HOSPITAL_BASED_OUTPATIENT_CLINIC_OR_DEPARTMENT_OTHER): Payer: Medicare HMO | Admitting: Internal Medicine

## 2022-09-15 ENCOUNTER — Encounter (HOSPITAL_BASED_OUTPATIENT_CLINIC_OR_DEPARTMENT_OTHER): Payer: Medicare HMO | Attending: Internal Medicine | Admitting: Internal Medicine

## 2022-09-15 DIAGNOSIS — I10 Essential (primary) hypertension: Secondary | ICD-10-CM | POA: Diagnosis not present

## 2022-09-15 DIAGNOSIS — G8220 Paraplegia, unspecified: Secondary | ICD-10-CM | POA: Diagnosis not present

## 2022-09-15 DIAGNOSIS — L89153 Pressure ulcer of sacral region, stage 3: Secondary | ICD-10-CM | POA: Diagnosis present

## 2022-09-15 DIAGNOSIS — N319 Neuromuscular dysfunction of bladder, unspecified: Secondary | ICD-10-CM | POA: Diagnosis not present

## 2022-09-16 NOTE — Progress Notes (Signed)
JERAMYAH, GOODPASTURE ([id number]) [id number] Page 1 of 9 Visit Report for 09/15/2022 Chief Complaint Document Details Patient Name: Date of Service: Jon Wells [name] 09/15/2022 2:00 PM Medical Record Number: [id number] Patient Account Number: [id number] Date of Birth/Sex: Treating RN: [birth year] (66 y.o. M) Primary Care Provider: Serita Grammes Other Clinician: Referring Provider: Treating Provider/Extender: [name] in Treatment: 11 Information Obtained from: Patient Chief Complaint 01/09/18; patient is here for a second opinion with regards to a pressure area on the lower sacrum/coccyx 06/30/2022; patient returns to clinic for again a pressure ulcer on the lower sacrum/coccyx Electronic Signature(s) Signed: 09/15/2022 4:36:48 PM By: Kalman Shan DO Entered By: Kalman Shan on 09/15/2022 16:25:33 -------------------------------------------------------------------------------- Debridement Details Patient Name: Date of Service: [name] [name] 09/15/2022 2:00 PM Medical Record Number: [id number] Patient Account Number: [id number] Date of Birth/Sex: Treating RN: 1956/09/12 (66 y.o. [name], [name] Primary Care Provider: Serita Grammes Other Clinician: Referring Provider: Treating Provider/Extender: [name] in Treatment: 11 Debridement Performed for Assessment: Wound #2 Sacrum Performed By: Physician Kalman Shan, DO Debridement Type: Debridement Level of Consciousness (Pre-procedure): Awake and Alert Pre-procedure Verification/Time Out Yes - 15:20 Taken: Start Time: 15:20 Pain Control: Lidocaine T Area Debrided (L x W): otal 3.6 (cm) x 2.6 (cm) = 9.36 (cm) Tissue and other material debrided: Viable, Non-Viable, Slough, Subcutaneous, Skin: Dermis , Slough Level: Skin/Subcutaneous Tissue Debridement Description: Excisional Instrument: Curette Bleeding: Minimum Hemostasis  Achieved: Pressure End Time: 15:20 Procedural Pain: 0 Post Procedural Pain: 0 Response to Treatment: Procedure was tolerated well Level of Consciousness (Post- Awake and Alert procedure): Post Debridement Measurements of Total Wound Length: (cm) 3.6 Stage: Category/Stage III Width: (cm) 2.6 Depth: (cm) 0.3 Volume: (cm) 2.205 Character of Wound/Ulcer Post Debridement: Improved ARIEL, WINGROVE ([id number]) [id number] Page 2 of 9 Post Procedure Diagnosis Same as Pre-procedure Electronic Signature(s) Signed: 09/15/2022 4:36:48 PM By: Kalman Shan DO Signed: 09/15/2022 5:26:06 PM By: Rhae Hammock RN Entered By: Rhae Hammock on 09/15/2022 15:21:14 -------------------------------------------------------------------------------- HPI Details Patient Name: Date of Service: [name] [name] 09/15/2022 2:00 PM Medical Record Number: [id number] Patient Account Number: [id number] Date of Birth/Sex: Treating RN: 11-24-1955 (66 y.o. 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 He is an active man has a wheelchair with a Roho cushion. Still active around the home. He is been battling an area on the lower sacrum/coccyx for about a year and according to him is been at the [facility] wound care center for 6-7 months. He didn't want [place] to call for records as he was largely here just for a second opinion and therefore I'm not completely certain what is been used to treat this wound. He is currently using calcium alginate. Has used Xeroform and apparently Prisma at least he appeared to recognize Prisma. He doesn't really have much in the way of additional medical issues. He is  hypertensive been only medication is lisinopril. He is not a diabetic. He does digital stimulation for bowel movements and in and out cath for voiding. 01/23/18; the patient arrives having cut a hole out of an old cushion for his wheelchair. This offloaded the area in question. He is also been using Endoform to the actual wounds. He arrives today  with the wounds less than half the size of last time. Really dramatic surprising improvement All capital readmission 06/30/2022 This is a 66 year old man who is now greater than 20 years post traumatic T12 lesion with resultant paralysis. We had them for 2 visits in 2019 for a wound in the same area I do not think he was discharged in a healed state. He tells me that the wound has varied in condition from time to time over the years most of the time and will gradually improve on its own however over the last 3 months or so he has developed the largest wound he has had and it just will not progress towards healing. He has been going to the wound care center in [place] and of course I have none of these notes. They apparently have had trouble getting a dressing to stay on in the area. In his wheelchair he has a Roho cushion with the coccyx area cut out. He tells me he is rigorous about offloading this in bed. Recently they cultured staph there and he has just completed antibiotics although were not sure which ones.He tells me that they could not get any dressing to adhere to the area. He is also concerned about continuous drainage Past medical history hypertension, T12 level paralysis. He has a neurogenic bladder and does in and out caths. His bowel movements via digital stimulation 9/22; patient presents for follow-up. He had a PCR culture done at last clinic visit that grew normal high levels of Enterococcus bacillus and coag negative staph. Keystone antibiotic ointment was ordered. Patient has not received this yet. He has been using silver alginate dressings.  He has no issues or complaints today. 10/6; patient presents for follow-up. He states he received Keystone antibiotic ointment and has been using this with silver alginate. He has no issues or complaints today. 11/30; patient was last seen in the beginning of October. He has missed his last clinic appointment. He states he has been using silver alginate to the wound bed. He has not been using Keystone antibiotic ointment. He currently denies signs of infection. It is unclear if he can offload this area very well. Electronic Signature(s) Signed: 09/15/2022 4:36:48 PM By: Kalman Shan DO Entered By: Kalman Shan on 09/15/2022 16:26:45 -------------------------------------------------------------------------------- Physical Exam Details Patient Name: Date of Service: [name] [name] 09/15/2022 2:00 PM Medical Record Number: [id number] Patient Account Number: [id number] Date of Birth/Sex: Treating RN: 1956/04/12 (66 y.o. M) Primary Care Provider: Serita Grammes Other Clinician: Referring Provider: Treating Provider/Extender: Dencil, Cayson ([id number]) [id number] Page 3 of 9 Weeks in Treatment: 11 Constitutional respirations regular, non-labored and within target range for patient.Marland Kitchen Psychiatric pleasant and cooperative. Notes T the lower part of the coccyx there is an open wound with rolled senescent edges with granulation tissue at the base and slough. No signs of surrounding o infection. Electronic Signature(s) Signed: 09/15/2022 4:36:48 PM By: Kalman Shan DO Entered By: Kalman Shan on 09/15/2022 16:27:16 -------------------------------------------------------------------------------- Physician Orders Details Patient Name: Date of Service: [name] [name] 09/15/2022 2:00 PM Medical Record Number: [id number] Patient Account Number: [id number] Date of Birth/Sex: Treating RN: 10-15-1956 (66 y.o. [name] Primary Care Provider: Serita Grammes Other Clinician: Referring Provider: Treating Provider/Extender: [name] in Treatment: 11 Verbal / Phone Orders: No Diagnosis Coding Follow-up Appointments Return appointment in 3 weeks. - w/ Dr. Heber Gardners Other: - Call and get your [place] refilled. We're giving you the number.  Bathing/ Shower/ Hygiene May shower with protection but do not get wound dressing(s) wet. Off-Loading Turn and reposition every 2 hours Wound Treatment Wound #2 - Sacrum Cleanser: Soap and Water 2 x Per Day/30 Days Discharge Instructions: May shower and wash wound with dial antibacterial soap and water prior to dressing change. Cleanser: Wound Cleanser (Generic) 2 x Per Day/30 Days Discharge Instructions: Cleanse the wound with wound cleanser prior to applying a clean dressing using gauze sponges, not tissue or cotton balls. Peri-Wound Care: Skin Prep (Generic) 2 x Per Day/30 Days Discharge Instructions: Use skin prep as directed Topical: Keystone 2 x Per Day/30 Days Prim Dressing: AquacelAg Advantage Dressing, 4x5 (in/in) (Generic) 2 x Per Day/30 Days ary Secondary Dressing: Woven Gauze Sponge, Non-Sterile 4x4 in (Generic) 2 x Per Day/30 Days Discharge Instructions: Apply over primary dressing as directed. Secondary Dressing: Zetuvit Plus Silicone Border Dressing 5x5 (in/in) (Generic) 2 x Per Day/30 Days Discharge Instructions: Apply silicone border over primary dressing as directed. Secured With: 66M Medipore H Soft Cloth Surgical T ape, 4 x 10 (in/yd) (Generic) 2 x Per Day/30 Days Discharge Instructions: Secure with tape as directed. Electronic Signature(s) Signed: 09/15/2022 4:36:48 PM By: Renato Shin ([id number]) [id number] Page 4 of 9 Entered By: Kalman Shan on 09/15/2022  16:27:24 -------------------------------------------------------------------------------- Problem List Details Patient Name: Date of Service: Jon Wells [name] 09/15/2022 2:00 PM Medical Record Number: [id number] Patient Account Number: [id number] Date of Birth/Sex: Treating RN: Nov 07, 1955 (66 y.o. M) Primary Care Provider: Serita Grammes Other Clinician: Referring Provider: Treating Provider/Extender: [name] in Treatment: 11 Active Problems ICD-10 Encounter Code Description Active Date MDM Diagnosis L89.153 Pressure ulcer of sacral region, stage 3 06/30/2022 No Yes S24.114D Complete lesion at T11-T12 level of thoracic spinal cord, subsequent encounter 06/30/2022 No Yes Inactive Problems Resolved Problems Electronic Signature(s) Signed: 09/15/2022 4:36:48 PM By: Kalman Shan DO Entered By: Kalman Shan on 09/15/2022 16:25:16 -------------------------------------------------------------------------------- Progress Note Details Patient Name: Date of Service: [name] [name] 09/15/2022 2:00 PM Medical Record Number: [id number] Patient Account Number: [id number] Date of Birth/Sex: Treating RN: 12-16-1955 (66 y.o. 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 He is an active man has a wheelchair with a Roho cushion. Still active around the home. He is been battling an area on the lower sacrum/coccyx for about a  year and according to him is been at the [facility] wound care center for 6-7 months. He didn't want [place] to call for records as he was largely here just for a second opinion and therefore I'm not completely certain what is been used to treat this wound. He is currently using calcium alginate. Has used Xeroform and apparently Prisma at least he appeared to recognize Prisma. He doesn't really have much in the way of additional medical issues. He is hypertensive been only medication is lisinopril. He is not a diabetic. He does digital stimulation for bowel movements and in and out cath for voiding. 01/23/18; the patient arrives having cut a hole  out of an old cushion for his wheelchair. This offloaded the area in question. He is also been using Endoform to the actual wounds. He arrives today with the wounds less than half the size of last time. Really dramatic surprising improvement All capital readmission EMMITTE, SURGEON ([id number]) [id number] Page 5 of 9 06/30/2022 This is a 66 year old man who is now greater than 20 years post traumatic T12 lesion with resultant paralysis. We had them for 2 visits in 2019 for a wound in the same area I do not think he was discharged in a healed state. He tells me that the wound has varied in condition from time to time over the years most of the time and will gradually improve on its own however over the last 3 months or so he has developed the largest wound he has had and it just will not progress towards healing. He has been going to the wound care center in [place] and of course I have none of these notes. They apparently have had trouble getting a dressing to stay on in the area. In his wheelchair he has a Roho cushion with the coccyx area cut out. He tells me he is rigorous about offloading this in bed. Recently they cultured staph there and he has just completed antibiotics although were not sure which ones.He tells me that they could not  get any dressing to adhere to the area. He is also concerned about continuous drainage Past medical history hypertension, T12 level paralysis. He has a neurogenic bladder and does in and out caths. His bowel movements via digital stimulation 9/22; patient presents for follow-up. He had a PCR culture done at last clinic visit that grew normal high levels of Enterococcus bacillus and coag negative staph. Keystone antibiotic ointment was ordered. Patient has not received this yet. He has been using silver alginate dressings. He has no issues or complaints today. 10/6; patient presents for follow-up. He states he received Keystone antibiotic ointment and has been using this with silver alginate. He has no issues or complaints today. 11/30; patient was last seen in the beginning of October. He has missed his last clinic appointment. He states he has been using silver alginate to the wound bed. He has not been using Keystone antibiotic ointment. He currently denies signs of infection. It is unclear if he can offload this area very well. Patient History Information obtained from Patient. Family History Diabetes - Mother, Hypertension - Mother,Father, Kidney Disease - Mother, No family history of Cancer, Heart Disease, Hereditary Spherocytosis, Lung Disease, Seizures, Stroke, Thyroid Problems, Tuberculosis. Social History Never smoker, Marital Status - Married, Alcohol Use - Rarely, Drug Use - No History, Caffeine Use - Daily - coffee. Medical History Eyes Denies history of Cataracts, Glaucoma, Optic Neuritis Ear/Nose/Mouth/Throat Denies history of Chronic sinus problems/congestion, Middle ear problems Hematologic/Lymphatic Denies history of Anemia, Hemophilia, Human Immunodeficiency Virus, Lymphedema, Sickle Cell Disease Respiratory Denies history of Aspiration, Asthma, Chronic Obstructive Pulmonary Disease (COPD), Pneumothorax, Sleep Apnea, Tuberculosis Cardiovascular Patient has history of  Hypertension Denies history of Angina, Arrhythmia, Congestive Heart Failure, Coronary Artery Disease, Deep Vein Thrombosis, Hypotension, Myocardial Infarction, Peripheral Arterial Disease, Peripheral Venous Disease, Phlebitis, Vasculitis Gastrointestinal Denies history of Cirrhosis , Colitis, Crohnoos, Hepatitis A, Hepatitis B, Hepatitis C Endocrine Denies history of Type I Diabetes, Type II Diabetes Genitourinary Denies history of End Stage Renal Disease Immunological Denies history of Lupus Erythematosus, Raynaudoos, Scleroderma Integumentary (Skin) Denies history of History of Burn Musculoskeletal Denies history of Gout, Rheumatoid Arthritis,  Osteoarthritis, Osteomyelitis Neurologic Patient has history of Paraplegia - T12 Denies history of Dementia, Neuropathy, Quadriplegia, Seizure Disorder Oncologic Denies history of Received Chemotherapy, Received Radiation Psychiatric Denies history of Anorexia/bulimia, Confinement Anxiety Hospitalization/Surgery History - back surgery. Medical A Surgical History Notes nd Genitourinary self cath Objective Constitutional respirations regular, non-labored and within target range for patient.. Vitals Time Taken: 2:24 PM, Temperature: 99.0 F, Pulse: 106 bpm, Respiratory Rate: 18 breaths/min, Blood Pressure: 157/82 mmHg. Psychiatric DEONDRAE, MCGRAIL ([id number]) [id number] Page 6 of 9 pleasant and cooperative. General Notes: T the lower part of the coccyx there is an open wound with rolled senescent edges with granulation tissue at the base and slough. No signs of o surrounding infection. Integumentary (Hair, Skin) Wound #2 status is Open. Original cause of wound was Pressure Injury. The date acquired was: 03/17/2022. The wound has been in treatment 11 weeks. The wound is located on the Sacrum. The wound measures 3.6cm length x 2.6cm width x 0.3cm depth; 7.351cm^2 area and 2.205cm^3 volume. There is Fat  Layer (Subcutaneous Tissue) exposed. There is no tunneling or undermining noted. There is a medium amount of serosanguineous drainage noted. The wound margin is distinct with the outline attached to the wound base. There is large (67-100%) red, pink granulation within the wound bed. There is a small (1-33%) amount of necrotic tissue within the wound bed including Adherent Slough. The periwound skin appearance did not exhibit: Callus, Crepitus, Excoriation, Induration, Rash, Scarring, Dry/Scaly, Maceration, Atrophie Blanche, Cyanosis, Ecchymosis, Hemosiderin Staining, Mottled, Pallor, Rubor, Erythema. Periwound temperature was noted as No Abnormality. Assessment Active Problems ICD-10 Pressure ulcer of sacral region, stage 3 Complete lesion at T11-T12 level of thoracic spinal cord, subsequent encounter Patient's wound is stable. I debrided nonviable tissue. We discussed potentially doing a skin substitute. Patient does not want to proceed with this. We also discussed potentially referring to plastic surgery for skin grafting or skin substitute. Patient also declined this. At this time I recommended continue with silver alginate. He should have a refill and Keystone antibiotic ointment I recommended restarting this. Continue aggressive offloading. Follow-up in 3 weeks. Procedures Wound #2 Pre-procedure diagnosis of Wound #2 is a Pressure Ulcer located on the Sacrum . There was a Excisional Skin/Subcutaneous Tissue Debridement with a total area of 9.36 sq cm performed by Kalman Shan, DO. With the following instrument(s): Curette to remove Viable and Non-Viable tissue/material. Material removed includes Subcutaneous Tissue, Slough, and Skin: Dermis after achieving pain control using Lidocaine. No specimens were taken. A time out was conducted at 15:20, prior to the start of the procedure. A Minimum amount of bleeding was controlled with Pressure. The procedure was tolerated well with a pain  level of 0 throughout and a pain level of 0 following the procedure. Post Debridement Measurements: 3.6cm length x 2.6cm width x 0.3cm depth; 2.205cm^3 volume. Post debridement Stage noted as Category/Stage III. Character of Wound/Ulcer Post Debridement is improved. Post procedure Diagnosis Wound #2: Same as Pre-Procedure Plan Follow-up Appointments: Return appointment in 3 weeks. - w/ Dr. Heber Garden Other: - Call and get your [place] refilled. We're giving you the number. Bathing/ Shower/ Hygiene: May shower with protection but do not get wound dressing(s) wet. Off-Loading: Turn and reposition every 2 hours WOUND #2: - Sacrum Wound Laterality: Cleanser: Soap and Water 2 x Per Day/30 Days Discharge Instructions: May shower and wash wound with dial antibacterial soap and water prior to dressing change. Cleanser: Wound Cleanser (Generic) 2 x Per Day/30 Days Discharge Instructions: Cleanse the wound  with wound cleanser prior to applying a clean dressing using gauze sponges, not tissue or cotton balls. Peri-Wound Care: Skin Prep (Generic) 2 x Per Day/30 Days Discharge Instructions: Use skin prep as directed Topical: Keystone 2 x Per Day/30 Days Prim Dressing: AquacelAg Advantage Dressing, 4x5 (in/in) (Generic) 2 x Per Day/30 Days ary Secondary Dressing: Woven Gauze Sponge, Non-Sterile 4x4 in (Generic) 2 x Per Day/30 Days Discharge Instructions: Apply over primary dressing as directed. Secondary Dressing: Zetuvit Plus Silicone Border Dressing 5x5 (in/in) (Generic) 2 x Per Day/30 Days Discharge Instructions: Apply silicone border over primary dressing as directed. Secured With: 67M Medipore H Soft Cloth Surgical T ape, 4 x 10 (in/yd) (Generic) 2 x Per Day/30 Days Discharge Instructions: Secure with tape as directed. 1. In office sharp debridement 2. Silver alginate with Keystone antibiotic ointment 4. Aggressive offloading 5. Follow-up in 3 weeks VIHAN, SANTAGATA ([id number])  [id number] Page 7 of 9 Electronic Signature(s) Signed: 09/15/2022 4:36:48 PM By: Kalman Shan DO Entered By: Kalman Shan on 09/15/2022 16:29:50 -------------------------------------------------------------------------------- HxROS Details Patient Name: Date of Service: [name], [place] [name] 09/15/2022 2:00 PM Medical Record Number: [id number] Patient Account Number: [id number] Date of Birth/Sex: Treating RN: 04/10/1956 (66 y.o. M) Primary Care Provider: Serita Grammes Other Clinician: Referring Provider: Treating Provider/Extender: [name] in Treatment: 11 Information Obtained From Patient Eyes Medical History: Negative for: Cataracts; Glaucoma; Optic Neuritis Ear/Nose/Mouth/Throat Medical History: Negative for: Chronic sinus problems/congestion; Middle ear problems Hematologic/Lymphatic Medical History: Negative for: Anemia; Hemophilia; Human Immunodeficiency Virus; Lymphedema; Sickle Cell Disease Respiratory Medical History: Negative for: Aspiration; Asthma; Chronic Obstructive Pulmonary Disease (COPD); Pneumothorax; Sleep Apnea; Tuberculosis Cardiovascular Medical History: Positive for: Hypertension Negative for: Angina; Arrhythmia; Congestive Heart Failure; Coronary Artery Disease; Deep Vein Thrombosis; Hypotension; Myocardial Infarction; Peripheral Arterial Disease; Peripheral Venous Disease; Phlebitis; Vasculitis Gastrointestinal Medical History: Negative for: Cirrhosis ; Colitis; Crohns; Hepatitis A; Hepatitis B; Hepatitis C Endocrine Medical History: Negative for: Type I Diabetes; Type II Diabetes Genitourinary Medical History: Negative for: End Stage Renal Disease Past Medical History Notes: self cath Immunological Medical History: Negative for: Lupus Erythematosus; Raynauds; Scleroderma Integumentary (Skin) Medical History: Negative for: History of Burn KEYMON, MCELROY ([id number])  [id number] Page 8 of 9 Musculoskeletal Medical History: Negative for: Gout; Rheumatoid Arthritis; Osteoarthritis; Osteomyelitis Neurologic Medical History: Positive for: Paraplegia - T12 Negative for: Dementia; Neuropathy; Quadriplegia; Seizure Disorder Oncologic Medical History: Negative for: Received Chemotherapy; Received Radiation Psychiatric Medical History: Negative for: Anorexia/bulimia; Confinement Anxiety Immunizations Pneumococcal Vaccine: Received Pneumococcal Vaccination: No Implantable Devices No devices added Hospitalization / Surgery History Type of Hospitalization/Surgery back surgery Family and Social History Cancer: No; Diabetes: Yes - Mother; Heart Disease: No; Hereditary Spherocytosis: No; Hypertension: Yes - Mother,Father; Kidney Disease: Yes - Mother; Lung Disease: No; Seizures: No; Stroke: No; Thyroid Problems: No; Tuberculosis: No; Never smoker; Marital Status - Married; Alcohol Use: Rarely; Drug Use: No History; Caffeine Use: Daily - coffee; Financial Concerns: No; Food, Clothing or Shelter Needs: No; Support System Lacking: No; Transportation Concerns: No Electronic Signature(s) Signed: 09/15/2022 4:36:48 PM By: Kalman Shan DO Entered By: Kalman Shan on 09/15/2022 16:26:50 -------------------------------------------------------------------------------- SuperBill Details Patient Name: Date of Service: [name] [name] 09/15/2022 Medical Record Number: [id number] Patient Account Number: [id number] Date of Birth/Sex: Treating RN: 08-05-1956 (66 y.o. [name] Primary Care Provider: Serita Grammes Other Clinician: Referring Provider: Treating Provider/Extender: [name] in Treatment: 11 Diagnosis Coding ICD-10 Codes Code Description [phone] Pressure ulcer of sacral region, stage 3 S24.114D Complete  lesion at T11-T12 level of thoracic spinal cord, subsequent  encounter Facility Procedures : NYKO, GELL Code: [id number] MES E ([id number]) Description: 11042 - DEB SUBQ TISSUE 20 SQ CM/< ICD-10 Diagnosis Description L89.153 Pressure ulcer of sacral region, stage 3 [id number] Modifier: _[id number] Quantity: 1 Page 9 of 9 Physician Procedures : CPT4 Code Description Modifier [id number] 11042 - WC PHYS SUBQ TISS 20 SQ CM ICD-10 Diagnosis Description L89.153 Pressure ulcer of sacral region, stage 3 Quantity: 1 Electronic Signature(s) Signed: 09/15/2022 4:36:48 PM By: Kalman Shan DO Entered By: Kalman Shan on 09/15/2022 16:30:06

## 2022-09-20 NOTE — Progress Notes (Signed)
MARQUIZE, SEIB ([id number]) [id number] Page 1 of 7 Visit Report for 09/15/2022 Arrival Information Details Patient Name: Date of Service: Jon Wells. 09/15/2022 2:00 PM Medical Record Number: [id number] Patient Account Number: [id number] Date of Birth/Sex: Treating RN: [birth year] (66 y.o. Jon Wells Primary Care Jon Wells: Jon Wells Other Clinician: Referring Jon Wells: Treating Jon Wells/Extender: [name] in Treatment: 11 Visit Information History Since Last Visit Added or deleted any medications: No Patient Arrived: Wheel Chair Any new allergies or adverse reactions: No Arrival Time: 14:29 Had a fall or experienced change in No Accompanied By: self activities of daily living that may affect Transfer Assistance: None risk of falls: Patient Identification Verified: Yes Signs or symptoms of abuse/neglect since last visito No Secondary Verification Process Completed: Yes Hospitalized since last visit: No Patient Requires Transmission-Based Precautions: No Implantable device outside of the clinic excluding No Patient Has Alerts: No cellular tissue based products placed in the center since last visit: Has Dressing in Place as Prescribed: No Pain Present Now: No Electronic Signature(s) Signed: 09/20/2022 3:48:14 PM By: Jon Creamer RN, BSN Entered By: Jon Wells on 09/15/2022 14:29:51 -------------------------------------------------------------------------------- Encounter Discharge Information Details Patient Name: Date of Service: Jon Wells. 09/15/2022 2:00 PM Medical Record Number: [id number] Patient Account Number: [id number] Date of Birth/Sex: Treating RN: [birth year] (67 y.o. Jon Wells, Jon Wells: Jon Wells Other Clinician: Referring Jon Wells: Treating Jon Wells/Extender: [name] in Treatment: 11 Encounter Discharge Information  Items Post Procedure Vitals Discharge Condition: Stable Temperature (F): 98.7 Ambulatory Status: Wheelchair Pulse (bpm): 74 Discharge Destination: Home Respiratory Rate (breaths/min): 17 Transportation: Private Auto Blood Pressure (mmHg): 134/74 Accompanied By: self Schedule Follow-up Appointment: Yes Clinical Summary of Care: Patient Declined Electronic Signature(s) Signed: 09/15/2022 5:26:06 PM By: Jon Hammock RN Entered By: Jon Wells on 09/15/2022 15:22:34 Jon Wells, Jon Wells ([id number]) [id number] Page 2 of 7 -------------------------------------------------------------------------------- Lower Extremity Assessment Details Patient Name: Date of Service: Jon Wells. 09/15/2022 2:00 PM Medical Record Number: [id number] Patient Account Number: [id number] Date of Birth/Sex: Treating RN: [birth year] (66 y.o. Jon Wells Primary Care Jon Wells: Jon Wells Other Clinician: Referring Da Authement: Treating [name]/Extender: [name] in Treatment: 11 Electronic Signature(s) Signed: 09/20/2022 3:48:14 PM By: Jon Creamer RN, BSN Entered By: Jon Wells on 09/15/2022 14:31:06 -------------------------------------------------------------------------------- Multi Wound Chart Details Patient Name: Date of Service: Jon Wells. 09/15/2022 2:00 PM Medical Record Number: [id number] Patient Account Number: [id number] Date of Birth/Sex: Treating RN: Jon Wells (66 y.o. M) Primary Care Andrian Urbach: Jon Wells Other Clinician: Referring Jarelle Ates: Treating [name]/Extender: [name] in Treatment: 11 Vital Signs Height(in): Pulse(bpm): 106 Weight(lbs): Blood Pressure(mmHg): 157/82 Body Mass Index(BMI): Temperature(F): 99.0 Respiratory Rate(breaths/min): 18 [2:Photos:] [N/A:N/A] Sacrum N/A N/A Wound Location: Pressure Injury N/A N/A Wounding Event: Pressure  Ulcer N/A N/A Primary Etiology: Hypertension, Paraplegia N/A N/A Comorbid History: 03/17/2022 N/A N/A Date Acquired: 11 N/A N/A Weeks of Treatment: Open N/A N/A Wound Status: No N/A N/A Wound Recurrence: 3.6x2.6x0.3 N/A N/A Measurements L x W x D (cm) 7.351 N/A N/A A (cm) : rea 2.205 N/A N/A Volume (cm) : 31.90% N/A N/A % Reduction in A rea: 31.90% N/A N/A % Reduction in Volume: Category/Stage III N/A N/A Classification: Medium N/A N/A Exudate A mount: Serosanguineous N/A N/A Exudate Type: red, brown N/A N/A Exudate Color: Distinct, outline attached N/A N/A Wound Margin: Large (67-100%) N/A N/A Granulation A mount: Red, Pink N/A N/A Granulation Quality: Small (1-33%)  N/A N/A Necrotic A mount: Fat Layer (Subcutaneous Tissue): Yes N/A N/A Exposed Structures: Jon Wells ([id number]) [id number] Page 3 of 7 Fascia: No Tendon: No Muscle: No Joint: No Bone: No None N/A N/A Epithelialization: Debridement - Excisional N/A N/A Debridement: Pre-procedure Verification/Time Out 15:20 N/A N/A Taken: Lidocaine N/A N/A Pain Control: Subcutaneous, Slough N/A N/A Tissue Debrided: Skin/Subcutaneous Tissue N/A N/A Level: 9.36 N/A N/A Debridement A (sq cm): rea Curette N/A N/A Instrument: Minimum N/A N/A Bleeding: Pressure N/A N/A Hemostasis A chieved: 0 N/A N/A Procedural Pain: 0 N/A N/A Post Procedural Pain: Procedure was tolerated well N/A N/A Debridement Treatment Response: 3.6x2.6x0.3 N/A N/A Post Debridement Measurements L x W x D (cm) 2.205 N/A N/A Post Debridement Volume: (cm) Category/Stage III N/A N/A Post Debridement Stage: Excoriation: No N/A N/A Periwound Skin Texture: Induration: No Callus: No Crepitus: No Rash: No Scarring: No Maceration: No N/A N/A Periwound Skin Moisture: Dry/Scaly: No Atrophie Blanche: No N/A N/A Periwound Skin Color: Cyanosis: No Ecchymosis: No Erythema: No Hemosiderin  Staining: No Mottled: No Pallor: No Rubor: No No Abnormality N/A N/A Temperature: Debridement N/A N/A Procedures Performed: Treatment Notes Wound #2 (Sacrum) Cleanser Soap and Water Discharge Instruction: May shower and wash wound with dial antibacterial soap and water prior to dressing change. Wound Cleanser Discharge Instruction: Cleanse the wound with wound cleanser prior to applying a clean dressing using gauze sponges, not tissue or cotton balls. Peri-Wound Care Skin Prep Discharge Instruction: Use skin prep as directed Topical Keystone Primary Dressing AquacelAg Advantage Dressing, 4x5 (in/in) Secondary Dressing Woven Gauze Sponge, Non-Sterile 4x4 in Discharge Instruction: Apply over primary dressing as directed. Zetuvit Plus Silicone Border Dressing 5x5 (in/in) Discharge Instruction: Apply silicone border over primary dressing as directed. Secured With 8M Medipore H Soft Cloth Surgical T ape, 4 x 10 (in/yd) Discharge Instruction: Secure with tape as directed. Compression Wrap Compression Stockings Add-Ons Electronic Signature(s) Signed: 09/15/2022 4:36:48 PM By: Brandy Hale Wells 09/15/2022 4:36:48 PM By: Kalman Shan DO Signed: ([id number]) [id number] Page 4 of 7 Entered By: Kalman Shan on 09/15/2022 16:25:22 -------------------------------------------------------------------------------- Multi-Disciplinary Care Plan Details Patient Name: Date of Service: Jon Wells. 09/15/2022 2:00 PM Medical Record Number: [id number] Patient Account Number: [id number] Date of Birth/Sex: Treating RN: [birth year] (66 y.o. [name] Primary Care Rjay Revolorio: Jon Wells Other Clinician: Referring Kersti Scavone: Treating Galit Urich/Extender: [name] in Treatment: 11 Active Inactive Electronic Signature(s) Signed: 09/15/2022 5:26:06 PM By: Jon Hammock RN Entered By: Jon Wells on 09/15/2022 15:21:35 -------------------------------------------------------------------------------- Pain Assessment Details Patient Name: Date of Service: Jon Wells. 09/15/2022 2:00 PM Medical Record Number: [id number] Patient Account Number: [id number] Date of Birth/Sex: Treating RN: [birth year] (66 y.o. Jon Wells Primary Care Zamier Eggebrecht: Jon Wells Other Clinician: Referring Inesha Sow: Treating Evarose Altland/Extender: [name] in Treatment: 11 Active Problems Location of Pain Severity and Description of Pain Patient Has Paino No Site Locations Pain Management and Medication Current Pain Management: Electronic Signature(s) Signed: 09/20/2022 3:48:14 PM By: Jon Creamer RN, BSN Jon Wells, Jon Wells ([id number]) [id number] Page 5 of 7 Entered By: Jon Wells on 09/15/2022 14:30:40 -------------------------------------------------------------------------------- Patient/Caregiver Education Details Patient Name: Date of Service: Jon [name] MES Wells. 11/30/2023andnbsp2:00 PM Medical Record Number: [id number] Patient Account Number: [id number] Date of Birth/Gender: Treating RN: [birth year] (66 y.o. [name] Primary Care Physician: Jon Wells Other Clinician: Referring Physician: Treating Physician/Extender: [name] in Treatment: 11 Education Assessment Education Provided To: Patient Education Topics  Provided Wound/Skin Impairment: Methods: Explain/Verbal Responses: Reinforcements needed, State content correctly Electronic Signature(s) Signed: 09/15/2022 5:26:06 PM By: Jon Hammock RN Entered By: Jon Wells on 09/15/2022 15:21:47 -------------------------------------------------------------------------------- Wound Assessment Details Patient Name: Date of Service: Jon [name] MES Wells. 09/15/2022 2:00 PM Medical Record Number: [id number] Patient  Account Number: [id number] Date of Birth/Sex: Treating RN: [birth year] (66 y.o. Jon Wells Primary Care Trung Wenzl: Jon Wells Other Clinician: Referring Reinaldo Helt: Treating Kerstin Crusoe/Extender: [name] in Treatment: 11 Wound Status Wound Number: 2 Primary Etiology: Pressure Ulcer Wound Location: Sacrum Wound Status: Open Wounding Event: Pressure Injury Comorbid History: Hypertension, Paraplegia Date Acquired: 03/17/2022 Weeks Of Treatment: 11 Clustered Wound: No Photos Jon Wells, Jon Wells ([id number]) [id number] Page 6 of 7 Wound Measurements Length: (cm) 3.6 Width: (cm) 2.6 Depth: (cm) 0.3 Area: (cm) 7.351 Volume: (cm) 2.205 % Reduction in Area: 31.9% % Reduction in Volume: 31.9% Epithelialization: None Tunneling: No Undermining: No Wound Description Classification: Category/Stage III Wound Margin: Distinct, outline attached Exudate Amount: Medium Exudate Type: Serosanguineous Exudate Color: red, brown Foul Odor After Cleansing: No Slough/Fibrino Yes Wound Bed Granulation Amount: Large (67-100%) Exposed Structure Granulation Quality: Red, Pink Fascia Exposed: No Necrotic Amount: Small (1-33%) Fat Layer (Subcutaneous Tissue) Exposed: Yes Necrotic Quality: Adherent Slough Tendon Exposed: No Muscle Exposed: No Joint Exposed: No Bone Exposed: No Periwound Skin Texture Texture Color No Abnormalities Noted: No No Abnormalities Noted: No Callus: No Atrophie Blanche: No Crepitus: No Cyanosis: No Excoriation: No Ecchymosis: No Induration: No Erythema: No Rash: No Hemosiderin Staining: No Scarring: No Mottled: No Pallor: No Moisture Rubor: No No Abnormalities Noted: No Dry / Scaly: No Temperature / Pain Maceration: No Temperature: No Abnormality Treatment Notes Wound #2 (Sacrum) Cleanser Soap and Water Discharge Instruction: May shower and wash wound with dial antibacterial soap and water  prior to dressing change. Wound Cleanser Discharge Instruction: Cleanse the wound with wound cleanser prior to applying a clean dressing using gauze sponges, not tissue or cotton balls. Peri-Wound Care Skin Prep Discharge Instruction: Use skin prep as directed Topical Keystone Primary Dressing AquacelAg Advantage Dressing, 4x5 (in/in) Secondary Dressing Woven Gauze Sponge, Non-Sterile 4x4 in Discharge Instruction: Apply over primary dressing as directed. Zetuvit Plus Silicone Border Dressing 5x5 (in/in) Discharge Instruction: Apply silicone border over primary dressing as directed. Secured With 81M Medipore H Soft Cloth Surgical T ape, 4 x 10 (in/yd) Discharge Instruction: Secure with tape as directed. Compression Wrap Compression Stockings Add-Ons Electronic Signature(s) Jon Wells, Jon Wells ([id number]) [phone].pdf Page 7 of 7 Signed: 09/20/2022 3:48:14 PM By: Jon Creamer RN, BSN Entered By: Jon Wells on 09/15/2022 14:33:12 -------------------------------------------------------------------------------- Vitals Details Patient Name: Date of Service: Jon [name] MES Wells. 09/15/2022 2:00 PM Medical Record Number: [id number] Patient Account Number: [id number] Date of Birth/Sex: Treating RN: [birth year] (66 y.o. Jon Wells Primary Care Lavaun Greenfield: Jon Wells Other Clinician: Referring Breeze Angell: Treating Ortencia Askari/Extender: [name] in Treatment: 11 Vital Signs Time Taken: 14:24 Temperature (F): 99.0 Pulse (bpm): 106 Respiratory Rate (breaths/min): 18 Blood Pressure (mmHg): 157/82 Reference Range: 80 - 120 mg / dl Electronic Signature(s) Signed: 09/20/2022 3:48:14 PM By: Jon Creamer RN, BSN Entered By: Jon Wells on 09/15/2022 14:30:34

## 2022-10-06 ENCOUNTER — Encounter (HOSPITAL_BASED_OUTPATIENT_CLINIC_OR_DEPARTMENT_OTHER): Payer: Medicare HMO | Attending: Internal Medicine | Admitting: Internal Medicine

## 2022-10-06 DIAGNOSIS — I10 Essential (primary) hypertension: Secondary | ICD-10-CM | POA: Insufficient documentation

## 2022-10-06 DIAGNOSIS — L89153 Pressure ulcer of sacral region, stage 3: Secondary | ICD-10-CM | POA: Insufficient documentation

## 2022-10-06 DIAGNOSIS — X58XXXD Exposure to other specified factors, subsequent encounter: Secondary | ICD-10-CM | POA: Insufficient documentation

## 2022-10-06 DIAGNOSIS — G8220 Paraplegia, unspecified: Secondary | ICD-10-CM | POA: Insufficient documentation

## 2022-10-06 DIAGNOSIS — S24114D Complete lesion at T11-T12 level of thoracic spinal cord, subsequent encounter: Secondary | ICD-10-CM | POA: Diagnosis not present

## 2022-10-20 NOTE — Progress Notes (Signed)
TAIT, BALISTRERI ([id number]) [id number] Page 1 of 7 Visit Report for 10/06/2022 Arrival Information Details Patient Name: Date of Service: Jon Wells [name] 10/06/2022 12:30 PM Medical Record Number: [id number] Patient Account Number: [id number] Date of Birth/Sex: Treating RN: 1956-03-03 (67 y.o. M) Primary Care Kashden Deboy: Serita Grammes Other Clinician: Referring Breea Loncar: Treating Naveena Eyman/Extender: [name] in Treatment: 31 Visit Information History Since Last Visit Added or deleted any medications: No Patient Arrived: Wheel Chair Any new allergies or adverse reactions: No Arrival Time: 13:03 Had a fall or experienced change in No Accompanied By: self activities of daily living that may affect Transfer Assistance: None risk of falls: Patient Identification Verified: Yes Signs or symptoms of abuse/neglect since last visito No Secondary Verification Process Completed: Yes Hospitalized since last visit: No Patient Requires Transmission-Based Precautions: No Implantable device outside of the clinic excluding No Patient Has Alerts: No cellular tissue based products placed in the center since last visit: Has Dressing in Place as Prescribed: Yes Pain Present Now: No Electronic Signature(s) Signed: 10/07/2022 8:46:25 AM By: Erenest Blank Entered By: Erenest Blank on 10/06/2022 13:03:49 -------------------------------------------------------------------------------- Encounter Discharge Information Details Patient Name: Date of Service: Jon Wells [name] 10/06/2022 12:30 PM Medical Record Number: [id number] Patient Account Number: [id number] Date of Birth/Sex: Treating RN: November 12, 1955 (67 y.o. Jon Wells, Jon Wells Primary Care Duke Weisensel: Serita Grammes Other Clinician: Referring Contessa Preuss: Treating Talesha Ellithorpe/Extender: [name] in Treatment: 14 Encounter Discharge Information Items Post  Procedure Vitals Discharge Condition: Stable Temperature (F): 98.1 Ambulatory Status: Wheelchair Pulse (bpm): 74 Discharge Destination: Home Respiratory Rate (breaths/min): 17 Transportation: Private Auto Blood Pressure (mmHg): 133/82 Accompanied By: self Schedule Follow-up Appointment: Yes Clinical Summary of Care: Patient Declined Electronic Signature(s) Signed: 10/19/2022 5:35:40 PM By: Rhae Hammock RN Entered By: Rhae Hammock on 10/06/2022 13:36:41 Mella, Daleen Bo ([id number]) [id number] Page 2 of 7 -------------------------------------------------------------------------------- Lower Extremity Assessment Details Patient Name: Date of Service: Jon Wells [name] 10/06/2022 12:30 PM Medical Record Number: [id number] Patient Account Number: [id number] Date of Birth/Sex: Treating RN: 1956/10/01 (67 y.o. M) Primary Care Doniqua Saxby: Serita Grammes Other Clinician: Referring Alynna Hargrove: Treating Martena Emanuele/Extender: [name] in Treatment: 14 Electronic Signature(s) Signed: 10/07/2022 8:46:25 AM By: Erenest Blank Entered By: Erenest Blank on 10/06/2022 13:04:29 -------------------------------------------------------------------------------- Multi Wound Chart Details Patient Name: Date of Service: Jon Wells [name] 10/06/2022 12:30 PM Medical Record Number: [id number] Patient Account Number: [id number] Date of Birth/Sex: Treating RN: 27-Nov-1955 (67 y.o. M) Primary Care Jon Wells: Serita Grammes Other Clinician: Referring Argentina Kosch: Treating Fredericka Bottcher/Extender: [name] in Treatment: 14 Vital Signs Height(in): Pulse(bpm): 64 Weight(lbs): Blood Pressure(mmHg): 163/88 Body Mass Index(BMI): Temperature(F): 99 Respiratory Rate(breaths/min): 18 [2:Photos:] [N/A:N/A] Sacrum N/A N/A Wound Location: Pressure Injury N/A N/A Wounding Event: Pressure Ulcer N/A N/A Primary  Etiology: Hypertension, Paraplegia N/A N/A Comorbid History: 03/17/2022 N/A N/A Date Acquired: 14 N/A N/A Weeks of Treatment: Open N/A N/A Wound Status: No N/A N/A Wound Recurrence: 3.5x2.4x0.2 N/A N/A Measurements L x W x D (cm) 6.597 N/A N/A A (cm) : rea 1.319 N/A N/A Volume (cm) : 38.90% N/A N/A % Reduction in A rea: 59.30% N/A N/A % Reduction in Volume: Category/Stage III N/A N/A Classification: Medium N/A N/A Exudate A mount: Serosanguineous N/A N/A Exudate Type: red, brown N/A N/A Exudate Color: Distinct, outline attached N/A N/A Wound Margin: Large (67-100%) N/A N/A Granulation A mount: Red, Pink N/A N/A Granulation Quality: None Present (0%) N/A N/A Necrotic A mount: Fat Layer (  Subcutaneous Tissue): Yes N/A N/A Exposed Structures: CAP, MASSI ([id number]) [id number].pdf Page 3 of 7 Fascia: No Tendon: No Muscle: No Joint: No Bone: No Large (67-100%) N/A N/A Epithelialization: Debridement - Excisional N/A N/A Debridement: Pre-procedure Verification/Time Out 13:30 N/A N/A Taken: Lidocaine N/A N/A Pain Control: Subcutaneous, Slough N/A N/A Tissue Debrided: Skin/Subcutaneous Tissue N/A N/A Level: 8.4 N/A N/A Debridement A (sq cm): rea Curette N/A N/A Instrument: Minimum N/A N/A Bleeding: Pressure N/A N/A Hemostasis A chieved: 0 N/A N/A Procedural Pain: 0 N/A N/A Post Procedural Pain: Procedure was tolerated well N/A N/A Debridement Treatment Response: 3.5x2.4x0.2 N/A N/A Post Debridement Measurements L x W x D (cm) 1.319 N/A N/A Post Debridement Volume: (cm) Category/Stage III N/A N/A Post Debridement Stage: Excoriation: No N/A N/A Periwound Skin Texture: Induration: No Callus: No Crepitus: No Rash: No Scarring: No Maceration: No N/A N/A Periwound Skin Moisture: Dry/Scaly: No Atrophie Blanche: No N/A N/A Periwound Skin Color: Cyanosis: No Ecchymosis: No Erythema: No Hemosiderin Staining:  No Mottled: No Pallor: No Rubor: No No Abnormality N/A N/A Temperature: Debridement N/A N/A Procedures Performed: Treatment Notes Wound #2 (Sacrum) Cleanser Soap and Water Discharge Instruction: May shower and wash wound with dial antibacterial soap and water prior to dressing change. Wound Cleanser Discharge Instruction: Cleanse the wound with wound cleanser prior to applying a clean dressing using gauze sponges, not tissue or cotton balls. Peri-Wound Care Skin Prep Discharge Instruction: Use skin prep as directed Topical Keystone Primary Dressing AquacelAg Advantage Dressing, 4x5 (in/in) Secondary Dressing Woven Gauze Sponge, Non-Sterile 4x4 in Discharge Instruction: Apply over primary dressing as directed. Zetuvit Plus Silicone Border Dressing 5x5 (in/in) Discharge Instruction: Apply silicone border over primary dressing as directed. Secured With 77M Medipore H Soft Cloth Surgical T ape, 4 x 10 (in/yd) Discharge Instruction: Secure with tape as directed. Compression Wrap Compression Stockings Add-Ons Electronic Signature(s) Signed: 10/06/2022 2:00:17 PM By: Brandy Hale E 10/06/2022 2:00:17 PM By: Kalman Shan DO Signed: ([id number]) [id number] Page 4 of 7 Entered By: Kalman Shan on 10/06/2022 13:52:17 -------------------------------------------------------------------------------- Multi-Disciplinary Care Plan Details Patient Name: Date of Service: Jon Wells [name] 10/06/2022 12:30 PM Medical Record Number: [id number] Patient Account Number: [id number] Date of Birth/Sex: Treating RN: [birth year] (67 y.o. [name] Primary Care Salil Raineri: Serita Grammes Other Clinician: Referring Sharma Lawrance: Treating Arvell Pulsifer/Extender: [name] in Treatment: 14 Active Inactive Electronic Signature(s) Signed: 10/19/2022 5:35:40 PM By: Rhae Hammock RN Entered By: Rhae Hammock on  10/06/2022 13:32:57 -------------------------------------------------------------------------------- Pain Assessment Details Patient Name: Date of Service: Jon Wells [name] 10/06/2022 12:30 PM Medical Record Number: [id number] Patient Account Number: [id number] Date of Birth/Sex: Treating RN: 15-Oct-1956 (67 y.o. M) Primary Care Addysin Porco: Serita Grammes Other Clinician: Referring Magdeline Prange: Treating Glendel Jaggers/Extender: [name] in Treatment: 14 Active Problems Location of Pain Severity and Description of Pain Patient Has Paino No Site Locations Pain Management and Medication Current Pain Management: Electronic Signature(s) Signed: 10/07/2022 8:46:25 AM By: Lorra Hals ([id number]) [id number] Page 5 of 7 Entered By: Erenest Blank on 10/06/2022 13:04:21 -------------------------------------------------------------------------------- Patient/Caregiver Education Details Patient Name: Date of Service: [name] [name] 12/21/2023andnbsp12:30 PM Medical Record Number: [id number] Patient Account Number: [id number] Date of Birth/Gender: Treating RN: 11-Nov-1955 (67 y.o. [name] Primary Care Physician: Serita Grammes Other Clinician: Referring Physician: Treating Physician/Extender: [name] in Treatment: 14 Education Assessment Education Provided To: Patient Education Topics Provided Wound/Skin Impairment: Methods: Explain/Verbal Responses: Reinforcements needed, State content  correctly Electronic Signature(s) Signed: 10/19/2022 5:35:40 PM By: Rhae Hammock RN Entered By: Rhae Hammock on 10/06/2022 13:33:07 -------------------------------------------------------------------------------- Wound Assessment Details Patient Name: Date of Service: Jon Wells [name] 10/06/2022 12:30 PM Medical Record Number: [id number] Patient Account Number: [id number] Date of  Birth/Sex: Treating RN: 12/17/1955 (67 y.o. M) Primary Care Keatyn Jawad: Serita Grammes Other Clinician: Referring Laniah Grimm: Treating Starnisha Batrez/Extender: [name] in Treatment: 14 Wound Status Wound Number: 2 Primary Etiology: Pressure Ulcer Wound Location: Sacrum Wound Status: Open Wounding Event: Pressure Injury Comorbid History: Hypertension, Paraplegia Date Acquired: 03/17/2022 Weeks Of Treatment: 14 Clustered Wound: No Photos TRAVONTA, GILL ([id number]) [id number] Page 6 of 7 Wound Measurements Length: (cm) 3.5 Width: (cm) 2.4 Depth: (cm) 0.2 Area: (cm) 6.597 Volume: (cm) 1.319 % Reduction in Area: 38.9% % Reduction in Volume: 59.3% Epithelialization: Large (67-100%) Tunneling: No Undermining: No Wound Description Classification: Category/Stage III Wound Margin: Distinct, outline attached Exudate Amount: Medium Exudate Type: Serosanguineous Exudate Color: red, brown Foul Odor After Cleansing: No Slough/Fibrino Yes Wound Bed Granulation Amount: Large (67-100%) Exposed Structure Granulation Quality: Red, Pink Fascia Exposed: No Necrotic Amount: None Present (0%) Fat Layer (Subcutaneous Tissue) Exposed: Yes Tendon Exposed: No Muscle Exposed: No Joint Exposed: No Bone Exposed: No Periwound Skin Texture Texture Color No Abnormalities Noted: No No Abnormalities Noted: No Callus: No Atrophie Blanche: No Crepitus: No Cyanosis: No Excoriation: No Ecchymosis: No Induration: No Erythema: No Rash: No Hemosiderin Staining: No Scarring: No Mottled: No Pallor: No Moisture Rubor: No No Abnormalities Noted: No Dry / Scaly: No Temperature / Pain Maceration: No Temperature: No Abnormality Treatment Notes Wound #2 (Sacrum) Cleanser Soap and Water Discharge Instruction: May shower and wash wound with dial antibacterial soap and water prior to dressing change. Wound Cleanser Discharge Instruction:  Cleanse the wound with wound cleanser prior to applying a clean dressing using gauze sponges, not tissue or cotton balls. Peri-Wound Care Skin Prep Discharge Instruction: Use skin prep as directed Topical Keystone Primary Dressing AquacelAg Advantage Dressing, 4x5 (in/in) Secondary Dressing Woven Gauze Sponge, Non-Sterile 4x4 in Discharge Instruction: Apply over primary dressing as directed. Zetuvit Plus Silicone Border Dressing 5x5 (in/in) Discharge Instruction: Apply silicone border over primary dressing as directed. Secured With 60M Medipore H Soft Cloth Surgical T ape, 4 x 10 (in/yd) Discharge Instruction: Secure with tape as directed. Compression Wrap Compression Stockings Add-Ons Electronic Signature(s) AMRAM, MAYA ([id number]) [id number] Page 7 of 7 Signed: 10/07/2022 8:46:25 AM By: Erenest Blank Entered By: Erenest Blank on 10/06/2022 13:11:09 -------------------------------------------------------------------------------- Vitals Details Patient Name: Date of Service: [name] [name] 10/06/2022 12:30 PM Medical Record Number: [id number] Patient Account Number: [id number] Date of Birth/Sex: Treating RN: 1956/05/29 (67 y.o. M) Primary Care Patric Buckhalter: Serita Grammes Other Clinician: Referring Kenzly Rogoff: Treating Casyn Becvar/Extender: [name] in Treatment: 14 Vital Signs Time Taken: 13:03 Temperature (F): 99 Pulse (bpm): 64 Respiratory Rate (breaths/min): 18 Blood Pressure (mmHg): 163/88 Reference Range: 80 - 120 mg / dl Electronic Signature(s) Signed: 10/07/2022 8:46:25 AM By: Erenest Blank Entered By: Erenest Blank on 10/06/2022 13:04:08

## 2022-10-20 NOTE — Progress Notes (Signed)
Jon, Wells ([id number]) [id number] Page 1 of 9 Visit Report for 10/06/2022 Chief Complaint Document Details Patient Name: Date of Service: Jon Wells [name] 10/06/2022 12:30 PM Medical Record Number: [id number] Patient Account Number: [id number] Date of Birth/Sex: Treating RN: 06-Aug-1956 (67 y.o. M) Primary Care Provider: Serita Grammes Other Clinician: Referring Provider: Treating Provider/Extender: [name] in Treatment: 14 Information Obtained from: Patient Chief Complaint 01/09/18; patient is here for a second opinion with regards to a pressure area on the lower sacrum/coccyx 06/30/2022; patient returns to clinic for again a pressure ulcer on the lower sacrum/coccyx Electronic Signature(s) Signed: 10/06/2022 2:00:17 PM By: Kalman Shan DO Entered By: Kalman Shan on 10/06/2022 13:52:25 -------------------------------------------------------------------------------- Debridement Details Patient Name: Date of Service: Jon Jon Wells [name] 10/06/2022 12:30 PM Medical Record Number: [id number] Patient Account Number: [id number] Date of Birth/Sex: Treating RN: [birth year] (67 y.o. Jon Wells, Jon Wells Primary Care Provider: Serita Grammes Other Clinician: Referring Provider: Treating Provider/Extender: [name] in Treatment: 14 Debridement Performed for Assessment: Wound #2 Sacrum Performed By: Physician Kalman Shan, DO Debridement Type: Debridement Level of Consciousness (Pre-procedure): Awake and Alert Pre-procedure Verification/Time Out Yes - 13:30 Taken: Start Time: 13:30 Pain Control: Lidocaine T Area Debrided (L x W): otal 3.5 (cm) x 2.4 (cm) = 8.4 (cm) Tissue and other material debrided: Viable, Non-Viable, Slough, Subcutaneous, Slough Level: Skin/Subcutaneous Tissue Debridement Description: Excisional Instrument: Curette Bleeding: Minimum Hemostasis Achieved:  Pressure End Time: 13:30 Procedural Pain: 0 Post Procedural Pain: 0 Response to Treatment: Procedure was tolerated well Level of Consciousness (Post- Awake and Alert procedure): Post Debridement Measurements of Total Wound Length: (cm) 3.5 Stage: Category/Stage III Width: (cm) 2.4 Depth: (cm) 0.2 Volume: (cm) 1.319 Character of Wound/Ulcer Post Debridement: Improved Jon Wells, Jon Wells ([id number]) [id number] Page 2 of 9 Post Procedure Diagnosis Same as Pre-procedure Electronic Signature(s) Signed: 10/06/2022 2:00:17 PM By: Kalman Shan DO Signed: 10/19/2022 5:35:40 PM By: Rhae Hammock RN Entered By: Rhae Hammock on 10/06/2022 13:30:46 -------------------------------------------------------------------------------- HPI Details Patient Name: Date of Service: Jon Jon Wells [name] 10/06/2022 12:30 PM Medical Record Number: [id number] Patient Account Number: [id number] Date of Birth/Sex: Treating RN: 1956-09-16 (67 y.o. 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 He is an active man has a wheelchair with a Roho cushion. Still active around the home. He is been battling an area on the lower sacrum/coccyx for about a year and according to him is been at the [facility] wound care center for 6-7 months. He didn't want [place] to call for records as he was largely here just for a second opinion and therefore I'm not completely certain what is been used to treat this wound. He is currently using calcium alginate. Has used Xeroform and apparently Prisma at least he appeared to recognize Prisma. He doesn't really have much in the way of additional medical issues. He is hypertensive  been only medication is lisinopril. He is not a diabetic. He does digital stimulation for bowel movements and in and out cath for voiding. 01/23/18; the patient arrives having cut a hole out of an old cushion for his wheelchair. This offloaded the area in question. He is also been using Endoform to the actual wounds. He arrives today with the wounds  less than half the size of last time. Really dramatic surprising improvement All capital readmission 06/30/2022 This is a 67 year old man who is now greater than 20 years post traumatic T12 lesion with resultant paralysis. We had them for 2 visits in 2019 for a wound in the same area I do not think he was discharged in a healed state. He tells me that the wound has varied in condition from time to time over the years most of the time and will gradually improve on its own however over the last 3 months or so he has developed the largest wound he has had and it just will not progress towards healing. He has been going to the wound care center in [place] and of course I have none of these notes. They apparently have had trouble getting a dressing to stay on in the area. In his wheelchair he has a Roho cushion with the coccyx area cut out. He tells me he is rigorous about offloading this in bed. Recently they cultured staph there and he has just completed antibiotics although were not sure which ones.He tells me that they could not get any dressing to adhere to the area. He is also concerned about continuous drainage Past medical history hypertension, T12 level paralysis. He has a neurogenic bladder and does in and out caths. His bowel movements via digital stimulation 9/22; patient presents for follow-up. He had a PCR culture done at last clinic visit that grew normal high levels of Enterococcus bacillus and coag negative staph. Keystone antibiotic ointment was ordered. Patient has not received this yet. He has been using silver alginate dressings. He has no  issues or complaints today. 10/6; patient presents for follow-up. He states he received Keystone antibiotic ointment and has been using this with silver alginate. He has no issues or complaints today. 11/30; patient was last seen in the beginning of October. He has missed his last clinic appointment. He states he has been using silver alginate to the wound bed. He has not been using Keystone antibiotic ointment. He currently denies signs of infection. It is unclear if he can offload this area very well. 12/21; patient presents for follow-up. He has been using Keystone antibiotic ointment with silver alginate. He has no issues or complaints today. Electronic Signature(s) Signed: 10/06/2022 2:00:17 PM By: Kalman Shan DO Entered By: Kalman Shan on 10/06/2022 13:54:32 -------------------------------------------------------------------------------- Physical Exam Details Patient Name: Date of Service: Jon Jon Wells [name] 10/06/2022 12:30 PM Medical Record Number: [id number] Patient Account Number: [id number] Date of Birth/Sex: Treating RN: 14-Aug-1956 (67 y.o. Jon Wells, Jon Wells ([id number]) [id number] Page 3 of 9 Primary Care Provider: Serita Grammes Other Clinician: Referring Provider: Treating Provider/Extender: [name] in Treatment: 14 Constitutional respirations regular, non-labored and within target range for patient.. Cardiovascular 2+ dorsalis pedis/posterior tibialis pulses. Psychiatric pleasant and cooperative. Notes T the lower part of the coccyx there is an open wound with rolled senescent edges with granulation tissue at the base and slough. No signs of surrounding o infection. Electronic Signature(s) Signed: 10/06/2022 2:00:17 PM By: Kalman Shan DO Entered By: Kalman Shan on 10/06/2022 13:54:56 -------------------------------------------------------------------------------- Physician Orders  Details Patient Name: Date of Service: Jon Jon Wells [name] 10/06/2022 12:30 PM Medical Record Number: [id number] Patient Account Number: [id number] Date of Birth/Sex: Treating RN: 05-Oct-1956 (67 y.o. Jon Wells Primary Care Provider: Serita Grammes Other Clinician: Referring Provider: Treating Provider/Extender: [name] in Treatment: [phone] Verbal / Phone Orders: No Diagnosis  Coding Follow-up Appointments Return appointment in 1 month. - w/ Dr. Heber [place] Other: - Call and get your [place] refilled. We're giving you the number. Bathing/ Shower/ Hygiene May shower with protection but do not get wound dressing(s) wet. Off-Loading Turn and reposition every 2 hours Wound Treatment Wound #2 - Sacrum Cleanser: Soap and Water 2 x Per Day/30 Days Discharge Instructions: May shower and wash wound with dial antibacterial soap and water prior to dressing change. Cleanser: Wound Cleanser (Generic) 2 x Per Day/30 Days Discharge Instructions: Cleanse the wound with wound cleanser prior to applying a clean dressing using gauze sponges, not tissue or cotton balls. Peri-Wound Care: Skin Prep (Generic) 2 x Per Day/30 Days Discharge Instructions: Use skin prep as directed Topical: Keystone 2 x Per Day/30 Days Prim Dressing: AquacelAg Advantage Dressing, 4x5 (in/in) (Generic) 2 x Per Day/30 Days ary Secondary Dressing: Woven Gauze Sponge, Non-Sterile 4x4 in (Generic) 2 x Per Day/30 Days Discharge Instructions: Apply over primary dressing as directed. Secondary Dressing: Zetuvit Plus Silicone Border Dressing 5x5 (in/in) (Generic) 2 x Per Day/30 Days Discharge Instructions: Apply silicone border over primary dressing as directed. Secured With: 65M Medipore H Soft Cloth Surgical T ape, 4 x 10 (in/yd) (Generic) 2 x Per Day/30 Days Discharge Instructions: Secure with tape as directed. Jon Wells, Jon Wells ([id number]) [id number] Page 4 of  9 Electronic Signature(s) Signed: 10/06/2022 2:00:17 PM By: Kalman Shan DO Entered By: Kalman Shan on 10/06/2022 13:55:04 -------------------------------------------------------------------------------- Problem List Details Patient Name: Date of Service: Jon Wells [name] 10/06/2022 12:30 PM Medical Record Number: [id number] Patient Account Number: [id number] Date of Birth/Sex: Treating RN: Dec 13, 1955 (67 y.o. M) Primary Care Provider: Serita Grammes Other Clinician: Referring Provider: Treating Provider/Extender: [name] in Treatment: 14 Active Problems ICD-10 Encounter Code Description Active Date MDM Diagnosis L89.153 Pressure ulcer of sacral region, stage 3 06/30/2022 No Yes S24.114D Complete lesion at T11-T12 level of thoracic spinal cord, subsequent encounter 06/30/2022 No Yes Inactive Problems Resolved Problems Electronic Signature(s) Signed: 10/06/2022 2:00:17 PM By: Kalman Shan DO Entered By: Kalman Shan on 10/06/2022 13:52:12 -------------------------------------------------------------------------------- Progress Note Details Patient Name: Date of Service: Jon Jon Wells [name] 10/06/2022 12:30 PM Medical Record Number: [id number] Patient Account Number: [id number] Date of Birth/Sex: Treating RN: [birth year] (67 y.o. 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 He is an active man has  a wheelchair with a Roho cushion. Still active around the home. He is been battling an area on the lower sacrum/coccyx for about a year and according to him is been at the [facility] wound care center for 6-7 months. He didn't want [place] to call for records as he was largely here just for a second opinion and therefore Jon Wells, Jon Wells ([id number]) [id number] Page 5 of 9 not completely certain what is been used to treat this wound. He is currently using calcium alginate. Has used Xeroform and apparently Prisma at least he appeared to recognize Prisma. He doesn't really have much in the way of additional medical issues. He  is hypertensive been only medication is lisinopril. He is not a diabetic. He does digital stimulation for bowel movements and in and out cath for voiding. 01/23/18; the patient arrives having cut a hole out of an old cushion for his wheelchair. This offloaded the area in question. He is also been using Endoform to the actual wounds. He arrives today with the wounds less than half the size of last time. Really dramatic surprising improvement All capital readmission 06/30/2022 This is a 67 year old man who is now greater than 20 years post traumatic T12 lesion with resultant paralysis. We had them for 2 visits in 2019 for a wound in the same area I do not think he was discharged in a healed state. He tells me that the wound has varied in condition from time to time over the years most of the time and will gradually improve on its own however over the last 3 months or so he has developed the largest wound he has had and it just will not progress towards healing. He has been going to the wound care center in [place] and of course I have none of these notes. They apparently have had trouble getting a dressing to stay on in the area. In his wheelchair he has a Roho cushion with the coccyx area cut out. He tells me he is rigorous about offloading this in bed. Recently  they cultured staph there and he has just completed antibiotics although were not sure which ones.He tells me that they could not get any dressing to adhere to the area. He is also concerned about continuous drainage Past medical history hypertension, T12 level paralysis. He has a neurogenic bladder and does in and out caths. His bowel movements via digital stimulation 9/22; patient presents for follow-up. He had a PCR culture done at last clinic visit that grew normal high levels of Enterococcus bacillus and coag negative staph. Keystone antibiotic ointment was ordered. Patient has not received this yet. He has been using silver alginate dressings. He has no issues or complaints today. 10/6; patient presents for follow-up. He states he received Keystone antibiotic ointment and has been using this with silver alginate. He has no issues or complaints today. 11/30; patient was last seen in the beginning of October. He has missed his last clinic appointment. He states he has been using silver alginate to the wound bed. He has not been using Keystone antibiotic ointment. He currently denies signs of infection. It is unclear if he can offload this area very well. 12/21; patient presents for follow-up. He has been using Keystone antibiotic ointment with silver alginate. He has no issues or complaints today. Patient History Information obtained from Patient. Family History Diabetes - Mother, Hypertension - Mother,Father, Kidney Disease - Mother, No family history of Cancer, Heart Disease, Hereditary Spherocytosis, Lung Disease, Seizures, Stroke, Thyroid Problems, Tuberculosis. Social History Never smoker, Marital Status - Married, Alcohol Use - Rarely, Drug Use - No History, Caffeine Use - Daily - coffee. Medical History Eyes Denies history of Cataracts, Glaucoma, Optic Neuritis Ear/Nose/Mouth/Throat Denies history of Chronic sinus problems/congestion, Middle ear  problems Hematologic/Lymphatic Denies history of Anemia, Hemophilia, Human Immunodeficiency Virus, Lymphedema, Sickle Cell Disease Respiratory Denies history of Aspiration, Asthma, Chronic Obstructive Pulmonary Disease (COPD), Pneumothorax, Sleep Apnea, Tuberculosis Cardiovascular Patient has history of Hypertension Denies history of Angina, Arrhythmia, Congestive Heart Failure, Coronary Artery Disease, Deep Vein Thrombosis, Hypotension, Myocardial Infarction, Peripheral Arterial Disease, Peripheral Venous Disease, Phlebitis, Vasculitis Gastrointestinal Denies history of Cirrhosis , Colitis, Crohnoos,  Hepatitis A, Hepatitis B, Hepatitis C Endocrine Denies history of Type I Diabetes, Type II Diabetes Genitourinary Denies history of End Stage Renal Disease Immunological Denies history of Lupus Erythematosus, Raynaudoos, Scleroderma Integumentary (Skin) Denies history of History of Burn Musculoskeletal Denies history of Gout, Rheumatoid Arthritis, Osteoarthritis, Osteomyelitis Neurologic Patient has history of Paraplegia - T12 Denies history of Dementia, Neuropathy, Quadriplegia, Seizure Disorder Oncologic Denies history of Received Chemotherapy, Received Radiation Psychiatric Denies history of Anorexia/bulimia, Confinement Anxiety Hospitalization/Surgery History - back surgery. Medical A Surgical History Notes nd Genitourinary self cath Objective Jon Wells, Jon Wells ([id number]) [phone].pdf Page 6 of 9 Constitutional respirations regular, non-labored and within target range for patient.. Vitals Time Taken: 1:03 PM, Temperature: 99 F, Pulse: 64 bpm, Respiratory Rate: 18 breaths/min, Blood Pressure: 163/88 mmHg. Cardiovascular 2+ dorsalis pedis/posterior tibialis pulses. Psychiatric pleasant and cooperative. General Notes: T the lower part of the coccyx there is an open wound with rolled senescent edges with granulation tissue at the base and slough. No  signs of o surrounding infection. Integumentary (Hair, Skin) Wound #2 status is Open. Original cause of wound was Pressure Injury. The date acquired was: 03/17/2022. The wound has been in treatment 14 weeks. The wound is located on the Sacrum. The wound measures 3.5cm length x 2.4cm width x 0.2cm depth; 6.597cm^2 area and 1.319cm^3 volume. There is Fat Layer (Subcutaneous Tissue) exposed. There is no tunneling or undermining noted. There is a medium amount of serosanguineous drainage noted. The wound margin is distinct with the outline attached to the wound base. There is large (67-100%) red, pink granulation within the wound bed. There is no necrotic tissue within the wound bed. The periwound skin appearance did not exhibit: Callus, Crepitus, Excoriation, Induration, Rash, Scarring, Dry/Scaly, Maceration, Atrophie Blanche, Cyanosis, Ecchymosis, Hemosiderin Staining, Mottled, Pallor, Rubor, Erythema. Periwound temperature was noted as No Abnormality. Assessment Active Problems ICD-10 Pressure ulcer of sacral region, stage 3 Complete lesion at T11-T12 level of thoracic spinal cord, subsequent encounter Patient's wound is slightly smaller however tissue does not appear healthy. I debrided nonviable tissue. I recommended continuing with Keystone antibiotic ointment and silver alginate. Continue aggressive offloading. Follow-up in 1 month. Procedures Wound #2 Pre-procedure diagnosis of Wound #2 is a Pressure Ulcer located on the Sacrum . There was a Excisional Skin/Subcutaneous Tissue Debridement with a total area of 8.4 sq cm performed by Kalman Shan, DO. With the following instrument(s): Curette to remove Viable and Non-Viable tissue/material. Material removed includes Subcutaneous Tissue and Slough and after achieving pain control using Lidocaine. No specimens were taken. A time out was conducted at 13:30, prior to the start of the procedure. A Minimum amount of bleeding was controlled with  Pressure. The procedure was tolerated well with a pain level of 0 throughout and a pain level of 0 following the procedure. Post Debridement Measurements: 3.5cm length x 2.4cm width x 0.2cm depth; 1.319cm^3 volume. Post debridement Stage noted as Category/Stage III. Character of Wound/Ulcer Post Debridement is improved. Post procedure Diagnosis Wound #2: Same as Pre-Procedure Plan Follow-up Appointments: Return appointment in 1 month. - w/ Dr. Heber [place] Other: - Call and get your [place] refilled. We're giving you the number. Bathing/ Shower/ Hygiene: May shower with protection but do not get wound dressing(s) wet. Off-Loading: Turn and reposition every 2 hours WOUND #2: - Sacrum Wound Laterality: Cleanser: Soap and Water 2 x Per Day/30 Days Discharge Instructions: May shower and wash wound with dial antibacterial soap and water prior to dressing change. Cleanser: Wound Cleanser (Generic) 2 x Per  Day/30 Days Discharge Instructions: Cleanse the wound with wound cleanser prior to applying a clean dressing using gauze sponges, not tissue or cotton balls. Peri-Wound Care: Skin Prep (Generic) 2 x Per Day/30 Days Discharge Instructions: Use skin prep as directed Topical: Keystone 2 x Per Day/30 Days Prim Dressing: AquacelAg Advantage Dressing, 4x5 (in/in) (Generic) 2 x Per Day/30 Days ary Secondary Dressing: Woven Gauze Sponge, Non-Sterile 4x4 in (Generic) 2 x Per Day/30 Days Discharge Instructions: Apply over primary dressing as directed. Secondary Dressing: Zetuvit Plus Silicone Border Dressing 5x5 (in/in) (Generic) 2 x Per Day/30 Days Discharge Instructions: Apply silicone border over primary dressing as directed. Secured With: 67M Medipore H Soft Cloth Surgical T ape, 4 x 10 (in/yd) (Generic) 2 x Per Day/30 Days Discharge Instructions: Secure with tape as directed. Jon Wells, Jon Wells ([id number]) [id number] Page 7 of 9 1. In office sharp debridement 2. Keystone  antibiotic ointment and silver alginate 3. Aggressive offloading 4. Follow-up in 1 month Electronic Signature(s) Signed: 10/06/2022 2:00:17 PM By: Kalman Shan DO Entered By: Kalman Shan on 10/06/2022 13:56:16 -------------------------------------------------------------------------------- HxROS Details Patient Name: Date of Service: Jon Jon Wells [name] 10/06/2022 12:30 PM Medical Record Number: [id number] Patient Account Number: [id number] Date of Birth/Sex: Treating RN: [birth year] (67 y.o. M) Primary Care Provider: Serita Grammes Other Clinician: Referring Provider: Treating Provider/Extender: [name] in Treatment: 14 Information Obtained From Patient Eyes Medical History: Negative for: Cataracts; Glaucoma; Optic Neuritis Ear/Nose/Mouth/Throat Medical History: Negative for: Chronic sinus problems/congestion; Middle ear problems Hematologic/Lymphatic Medical History: Negative for: Anemia; Hemophilia; Human Immunodeficiency Virus; Lymphedema; Sickle Cell Disease Respiratory Medical History: Negative for: Aspiration; Asthma; Chronic Obstructive Pulmonary Disease (COPD); Pneumothorax; Sleep Apnea; Tuberculosis Cardiovascular Medical History: Positive for: Hypertension Negative for: Angina; Arrhythmia; Congestive Heart Failure; Coronary Artery Disease; Deep Vein Thrombosis; Hypotension; Myocardial Infarction; Peripheral Arterial Disease; Peripheral Venous Disease; Phlebitis; Vasculitis Gastrointestinal Medical History: Negative for: Cirrhosis ; Colitis; Crohns; Hepatitis A; Hepatitis B; Hepatitis C Endocrine Medical History: Negative for: Type I Diabetes; Type II Diabetes Genitourinary Medical History: Negative for: End Stage Renal Disease Past Medical History Notes: self cath Immunological Jon Wells, Jon Wells ([id number]) [id number] Page 8 of 9 Medical History: Negative for: Lupus Erythematosus; Raynauds;  Scleroderma Integumentary (Skin) Medical History: Negative for: History of Burn Musculoskeletal Medical History: Negative for: Gout; Rheumatoid Arthritis; Osteoarthritis; Osteomyelitis Neurologic Medical History: Positive for: Paraplegia - T12 Negative for: Dementia; Neuropathy; Quadriplegia; Seizure Disorder Oncologic Medical History: Negative for: Received Chemotherapy; Received Radiation Psychiatric Medical History: Negative for: Anorexia/bulimia; Confinement Anxiety Immunizations Pneumococcal Vaccine: Received Pneumococcal Vaccination: No Implantable Devices No devices added Hospitalization / Surgery History Type of Hospitalization/Surgery back surgery Family and Social History Cancer: No; Diabetes: Yes - Mother; Heart Disease: No; Hereditary Spherocytosis: No; Hypertension: Yes - Mother,Father; Kidney Disease: Yes - Mother; Lung Disease: No; Seizures: No; Stroke: No; Thyroid Problems: No; Tuberculosis: No; Never smoker; Marital Status - Married; Alcohol Use: Rarely; Drug Use: No History; Caffeine Use: Daily - coffee; Financial Concerns: No; Food, Clothing or Shelter Needs: No; Support System Lacking: No; Transportation Concerns: No Electronic Signature(s) Signed: 10/06/2022 2:00:17 PM By: Kalman Shan DO Entered By: Kalman Shan on 10/06/2022 13:54:38 -------------------------------------------------------------------------------- SuperBill Details Patient Name: Date of Service: Jon Jon Wells [name] 10/06/2022 Medical Record Number: [id number] Patient Account Number: [id number] Date of Birth/Sex: Treating RN: Jon Wells, 1957 (67 y.o. 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

## 2022-11-03 ENCOUNTER — Encounter (HOSPITAL_BASED_OUTPATIENT_CLINIC_OR_DEPARTMENT_OTHER): Payer: 59 | Attending: Internal Medicine | Admitting: Internal Medicine

## 2022-11-17 ENCOUNTER — Encounter (HOSPITAL_BASED_OUTPATIENT_CLINIC_OR_DEPARTMENT_OTHER): Payer: Medicare HMO | Attending: Internal Medicine | Admitting: Internal Medicine

## 2022-11-17 DIAGNOSIS — N319 Neuromuscular dysfunction of bladder, unspecified: Secondary | ICD-10-CM | POA: Insufficient documentation

## 2022-11-17 DIAGNOSIS — I10 Essential (primary) hypertension: Secondary | ICD-10-CM | POA: Insufficient documentation

## 2022-11-17 DIAGNOSIS — L89153 Pressure ulcer of sacral region, stage 3: Secondary | ICD-10-CM | POA: Diagnosis present

## 2022-11-17 DIAGNOSIS — G8220 Paraplegia, unspecified: Secondary | ICD-10-CM | POA: Insufficient documentation

## 2022-11-18 NOTE — Progress Notes (Signed)
NICOLUS, OSE ([id number]) [id number] Page 1 of 9 Visit Report for 11/17/2022 Chief Complaint Document Details Patient Name: Date of Service: Jon Wells [name] 11/17/2022 10:15 A M Medical Record Number: [id number] Patient Account Number: [id number] Date of Birth/Sex: Treating RN: Jan 07, 1956 (67 y.o. M) Primary Care Provider: Serita Grammes Other Clinician: Referring Provider: Treating Provider/Extender: [name] in Treatment: 20 Information Obtained from: Patient Chief Complaint 01/09/18; patient is here for a second opinion with regards to a pressure area on the lower sacrum/coccyx 06/30/2022; patient returns to clinic for again a pressure ulcer on the lower sacrum/coccyx Electronic Signature(s) Signed: 11/17/2022 1:45:23 PM By: Kalman Shan DO Entered By: Kalman Shan on 11/17/2022 11:22:08 -------------------------------------------------------------------------------- Debridement Details Patient Name: Date of Service: Jon Wells [name] 11/17/2022 10:15 A M Medical Record Number: [id number] Patient Account Number: [id number] Date of Birth/Sex: Treating RN: 1956/08/13 (67 y.o. Lorette Ang, Meta.Reding Primary Care Provider: Serita Grammes Other Clinician: Referring Provider: Treating Provider/Extender: [name] in Treatment: 20 Debridement Performed for Assessment: Wound #2 Sacrum Performed By: Physician Kalman Shan, DO Debridement Type: Debridement Level of Consciousness (Pre-procedure): Awake and Alert Pre-procedure Verification/Time Out Yes - 10:55 Taken: Start Time: 10:56 T Area Debrided (L x W): otal 2.7 (cm) x 1.5 (cm) = 4.05 (cm) Tissue and other material debrided: Viable, Non-Viable, Callus, Subcutaneous, Skin: Dermis , Skin: Epidermis Level: Skin/Subcutaneous Tissue Debridement Description: Excisional Instrument: Curette Bleeding: Minimum Hemostasis Achieved: Pressure End  Time: 10:59 Procedural Pain: 0 Post Procedural Pain: 0 Response to Treatment: Procedure was tolerated well Level of Consciousness (Post- Awake and Alert procedure): Post Debridement Measurements of Total Wound Length: (cm) 2.7 Stage: Category/Stage III Width: (cm) 1.5 Depth: (cm) 0.2 Volume: (cm) 0.636 Character of Wound/Ulcer Post Debridement: Improved Post Procedure Diagnosis Jon, Wells ([id number]) [id number] Page 2 of 9 Same as Pre-procedure Electronic Signature(s) Signed: 11/17/2022 1:45:23 PM By: Kalman Shan DO Signed: 11/17/2022 5:30:52 PM By: Deon Pilling RN, BSN Entered By: Deon Pilling on 11/17/2022 11:00:16 -------------------------------------------------------------------------------- HPI Details Patient Name: Date of Service: Jon Jon Wells [name] 11/17/2022 10:15 A M Medical Record Number: [id number] Patient Account Number: [id number] Date of Birth/Sex: Treating RN: 12/21/1955 (67 y.o. 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 He is an active man has a wheelchair with a Roho cushion. Still active around the home. He is been battling an area on the lower sacrum/coccyx for about a year and according to him is been at the [facility] wound care center for 6-7 months. He didn't want [place] to call for records as he was largely here just for a second opinion and therefore I'm not completely certain what is been used to treat this wound. He is currently using calcium alginate. Has used Xeroform and apparently Prisma at least he appeared to recognize Prisma. He doesn't really have much in the way of additional medical issues. He is hypertensive been only  medication is lisinopril. He is not a diabetic. He does digital stimulation for bowel movements and in and out cath for voiding. 01/23/18; the patient arrives having cut a hole out of an old cushion for his wheelchair. This offloaded the area in question. He is also been using Endoform to the actual wounds. He  arrives today with the wounds less than half the size of last time. Really dramatic surprising improvement All capital readmission 06/30/2022 This is a 67 year old man who is now greater than 20 years post traumatic T12 lesion with resultant paralysis. We had them for 2 visits in 2019 for a wound in the same area I do not think he was discharged in a healed state. He tells me that the wound has varied in condition from time to time over the years most of the time and will gradually improve on its own however over the last 3 months or so he has developed the largest wound he has had and it just will not progress towards healing. He has been going to the wound care center in [place] and of course I have none of these notes. They apparently have had trouble getting a dressing to stay on in the area. In his wheelchair he has a Roho cushion with the coccyx area cut out. He tells me he is rigorous about offloading this in bed. Recently they cultured staph there and he has just completed antibiotics although were not sure which ones.He tells me that they could not get any dressing to adhere to the area. He is also concerned about continuous drainage Past medical history hypertension, T12 level paralysis. He has a neurogenic bladder and does in and out caths. His bowel movements via digital stimulation 9/22; patient presents for follow-up. He had a PCR culture done at last clinic visit that grew normal high levels of Enterococcus bacillus and coag negative staph. Keystone antibiotic ointment was ordered. Patient has not received this yet. He has been using silver alginate dressings. He has no issues  or complaints today. 10/6; patient presents for follow-up. He states he received Keystone antibiotic ointment and has been using this with silver alginate. He has no issues or complaints today. 11/30; patient was last seen in the beginning of October. He has missed his last clinic appointment. He states he has been using silver alginate to the wound bed. He has not been using Keystone antibiotic ointment. He currently denies signs of infection. It is unclear if he can offload this area very well. 12/21; patient presents for follow-up. He has been using Keystone antibiotic ointment with silver alginate. He has no issues or complaints today. 2/1; patient presents for follow-up. He has been using Keystone antibiotic ointment with Aquacel Ag. He states he ran out of the antibiotic ointment. On that he has no issues or complaints today. Electronic Signature(s) Signed: 11/17/2022 1:45:23 PM By: Kalman Shan DO Entered By: Kalman Shan on 11/17/2022 11:22:31 -------------------------------------------------------------------------------- Physical Exam Details Patient Name: Date of Service: Jon Wells [name] 11/17/2022 10:15 A M Medical Record Number: [id number] Patient Account Number: [id number] MARIBEL, LUIS ([id number]) [phone].pdf Page 3 of 9 Date of Birth/Sex: Treating RN: [birth year] (67 y.o. M) Primary Care Provider: Other Clinician: Serita Grammes Referring Provider: Treating Provider/Extender: [name] in Treatment: 20 Constitutional respirations regular, non-labored and within target range for patient.. Cardiovascular 2+ dorsalis pedis/posterior tibialis pulses. Psychiatric pleasant and cooperative. Notes T the lower part of the coccyx there is an open wound with rolled senescent edges with granulation tissue at the base and slough. Devitalized tissue to the o edges circumferentially. No signs of surrounding  infection. Electronic Signature(s) Signed: 11/17/2022 1:45:23 PM By: Kalman Shan DO Entered By: Kalman Shan on 11/17/2022 11:23:50 -------------------------------------------------------------------------------- Physician Orders Details Patient Name: Date of Service: Jon Jon Wells [name] 11/17/2022 10:15  A M Medical Record Number: [id number] Patient Account Number: [id number] Date of Birth/Sex: Treating RN: 06/10/1956 (67 y.o. Hessie Diener Primary Care Provider: Serita Grammes Other Clinician: Referring Provider: Treating Provider/Extender: [name] in Treatment: 20 Verbal / Phone Orders: No Diagnosis Coding Follow-up Appointments Return appointment in 1 month. - w/ Dr. Heber [place] and [place] 1100 12/15/2022 Room 8 Other: - [place] to refill topical antibiotics. Off-Loading Roho cushion for wheelchair - continue to use. Turn and reposition every 2 hours Wound Treatment Wound #2 - Sacrum Cleanser: Soap and Water 1 x Per Day/30 Days Discharge Instructions: May shower and wash wound with dial antibacterial soap and water prior to dressing change. Cleanser: Wound Cleanser (Generic) 1 x Per Day/30 Days Discharge Instructions: Cleanse the wound with wound cleanser prior to applying a clean dressing using gauze sponges, not tissue or cotton balls. Peri-Wound Care: Skin Prep (DME) (Generic) 1 x Per Day/30 Days Discharge Instructions: Use skin prep as directed Topical: Keystone (DME) (Generic) 1 x Per Day/30 Days Prim Dressing: AquacelAg Advantage Dressing, 4x5 (in/in) (DME) (Generic) 1 x Per Day/30 Days ary Secondary Dressing: Woven Gauze Sponge, Non-Sterile 4x4 in (Generic) 1 x Per Day/30 Days Discharge Instructions: Apply over primary dressing as directed. Secondary Dressing: Zetuvit Plus Silicone Border Dressing 5x5 (in/in) (DME) (Generic) 1 x Per Day/30 Days Discharge Instructions: Apply silicone border over primary dressing as  directed. Secured With: 16M Medipore H Soft Cloth Surgical T ape, 4 x 10 (in/yd) (Generic) 1 x Per Day/30 Days Discharge Instructions: Secure with tape as directed. HOY, FALLERT ([id number]) [id number] Page 4 of 9 Electronic Signature(s) Signed: 11/17/2022 1:45:23 PM By: Kalman Shan DO Entered By: Kalman Shan on 11/17/2022 11:23:58 -------------------------------------------------------------------------------- Problem List Details Patient Name: Date of Service: Jon Wells [name] 11/17/2022 10:15 A M Medical Record Number: [id number] Patient Account Number: [id number] Date of Birth/Sex: Treating RN: Jun 25, 1956 (67 y.o. M) Primary Care Provider: Serita Grammes Other Clinician: Referring Provider: Treating Provider/Extender: [name] in Treatment: 20 Active Problems ICD-10 Encounter Code Description Active Date MDM Diagnosis L89.153 Pressure ulcer of sacral region, stage 3 06/30/2022 No Yes S24.114D Complete lesion at T11-T12 level of thoracic spinal cord, subsequent encounter 06/30/2022 No Yes Inactive Problems Resolved Problems Electronic Signature(s) Signed: 11/17/2022 1:45:23 PM By: Kalman Shan DO Entered By: Kalman Shan on 11/17/2022 11:21:59 -------------------------------------------------------------------------------- Progress Note Details Patient Name: Date of Service: Jon Wells [name] 11/17/2022 10:15 A M Medical Record Number: [id number] Patient Account Number: [id number] Date of Birth/Sex: Treating RN: Feb 10, 1956 (67 y.o. 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 He is an active man has a wheelchair with a Roho cushion. Still active around the home. He is been battling an area on the lower sacrum/coccyx for about a year and according to him is been at the [facility] wound care center for 6-7 months. He didn't want [place] to call for records as he was largely here just for a second opinion and therefore I'm not completely  certain what is been used to treat this wound. He is currently using calcium alginate. Has used Xeroform and apparently Prisma at least he appeared to recognize Prisma. He doesn't really have much in the way of additional medical issues. He is hypertensive been only medication is lisinopril. He is not a diabetic. He does digital stimulation for bowel movements and in and out cath for voiding. RONDA, KAZMI ([id number]) [id number] Page 5 of 9 01/23/18; the patient arrives having cut a hole out of an old cushion for his wheelchair. This offloaded the area in question. He is also been using Endoform to the actual wounds. He arrives today with the wounds less than half the size of last time. Really dramatic surprising improvement All capital readmission 06/30/2022 This is a 67 year old man who is now greater than 20 years post traumatic T12 lesion with resultant paralysis. We had them for 2 visits in 2019 for a wound in the same area I do not think he was discharged in a healed state. He tells me that the wound has varied in condition from time to time over the years most of the time and will gradually improve on its own however over the last 3 months or so he has developed the largest wound he has had and it just will not progress towards healing. He has been going to the wound care center in [place] and of course I have none  of these notes. They apparently have had trouble getting a dressing to stay on in the area. In his wheelchair he has a Roho cushion with the coccyx area cut out. He tells me he is rigorous about offloading this in bed. Recently they cultured staph there and he has just completed antibiotics although were not sure which ones.He tells me that they could not get any dressing to adhere to the area. He is also concerned about continuous drainage Past medical history hypertension, T12 level paralysis. He has a neurogenic bladder and does in and out caths. His bowel movements via digital stimulation 9/22; patient presents for follow-up. He had a PCR culture done at last clinic visit that grew normal high levels of Enterococcus bacillus and coag negative staph. Keystone antibiotic ointment was ordered. Patient has not received this yet. He has been using silver alginate dressings. He has no issues or complaints today. 10/6; patient presents for follow-up. He states he received Keystone antibiotic ointment and has been using this with silver alginate. He has no issues or complaints today. 11/30; patient was last seen in the beginning of October. He has missed his last clinic appointment. He states he has been using silver alginate to the wound bed. He has not been using Keystone antibiotic ointment. He currently denies signs of infection. It is unclear if he can offload this area very well. 12/21; patient presents for follow-up. He has been using Keystone antibiotic ointment with silver alginate. He has no issues or complaints today. 2/1; patient presents for follow-up. He has been using Keystone antibiotic ointment with Aquacel Ag. He states he ran out of the antibiotic ointment. On that he has no issues or complaints today. Patient History Information obtained from Patient. Family History Diabetes - Mother, Hypertension - Mother,Father, Kidney Disease - Mother, No family history of Cancer, Heart Disease,  Hereditary Spherocytosis, Lung Disease, Seizures, Stroke, Thyroid Problems, Tuberculosis. Social History Never smoker, Marital Status - Married, Alcohol Use - Rarely, Drug Use - No History, Caffeine Use - Daily - coffee. Medical History Eyes Denies  history of Cataracts, Glaucoma, Optic Neuritis Ear/Nose/Mouth/Throat Denies history of Chronic sinus problems/congestion, Middle ear problems Hematologic/Lymphatic Denies history of Anemia, Hemophilia, Human Immunodeficiency Virus, Lymphedema, Sickle Cell Disease Respiratory Denies history of Aspiration, Asthma, Chronic Obstructive Pulmonary Disease (COPD), Pneumothorax, Sleep Apnea, Tuberculosis Cardiovascular Patient has history of Hypertension Denies history of Angina, Arrhythmia, Congestive Heart Failure, Coronary Artery Disease, Deep Vein Thrombosis, Hypotension, Myocardial Infarction, Peripheral Arterial Disease, Peripheral Venous Disease, Phlebitis, Vasculitis Gastrointestinal Denies history of Cirrhosis , Colitis, Crohnoos, Hepatitis A, Hepatitis B, Hepatitis C Endocrine Denies history of Type I Diabetes, Type II Diabetes Genitourinary Denies history of End Stage Renal Disease Immunological Denies history of Lupus Erythematosus, Raynaudoos, Scleroderma Integumentary (Skin) Denies history of History of Burn Musculoskeletal Denies history of Gout, Rheumatoid Arthritis, Osteoarthritis, Osteomyelitis Neurologic Patient has history of Paraplegia - T12 Denies history of Dementia, Neuropathy, Quadriplegia, Seizure Disorder Oncologic Denies history of Received Chemotherapy, Received Radiation Psychiatric Denies history of Anorexia/bulimia, Confinement Anxiety Hospitalization/Surgery History - back surgery. Medical A Surgical History Notes nd Genitourinary self cath Objective CODEN, FRANCHI ([id number]) [phone].pdf Page 6 of 9 Constitutional respirations regular, non-labored and within target range for  patient.. Vitals Time Taken: 10:27 AM, Temperature: 98.7 F, Pulse: 80 bpm, Respiratory Rate: 18 breaths/min, Blood Pressure: 132/85 mmHg. Cardiovascular 2+ dorsalis pedis/posterior tibialis pulses. Psychiatric pleasant and cooperative. General Notes: T the lower part of the coccyx there is an open wound with rolled senescent edges with granulation tissue at the base and slough. Devitalized o tissue to the edges circumferentially. No signs of surrounding infection. Integumentary (Hair, Skin) Wound #2 status is Open. Original cause of wound was Pressure Injury. The date acquired was: 03/17/2022. The wound has been in treatment 20 weeks. The wound is located on the Sacrum. The wound measures 2.7cm length x 1.5cm width x 0.2cm depth; 3.181cm^2 area and 0.636cm^3 volume. There is Fat Layer (Subcutaneous Tissue) exposed. There is no tunneling or undermining noted. There is a medium amount of serosanguineous drainage noted. The wound margin is distinct with the outline attached to the wound base. There is large (67-100%) red, pink granulation within the wound bed. There is no necrotic tissue within the wound bed. The periwound skin appearance did not exhibit: Callus, Crepitus, Excoriation, Induration, Rash, Scarring, Dry/Scaly, Maceration, Atrophie Blanche, Cyanosis, Ecchymosis, Hemosiderin Staining, Mottled, Pallor, Rubor, Erythema. Periwound temperature was noted as No Abnormality. Assessment Active Problems ICD-10 Pressure ulcer of sacral region, stage 3 Complete lesion at T11-T12 level of thoracic spinal cord, subsequent encounter Patient's wound has shown improvement in size appearance since last clinic visit. I debrided nonviable tissue. I recommended continuing Aquacel Ag and we will help facilitate a refill for the [facility] antibiotic ointment. Continue aggressive offloading. Follow-up in 1 month. Procedures Wound #2 Pre-procedure diagnosis of Wound #2 is a Pressure Ulcer located on the  Sacrum . There was a Excisional Skin/Subcutaneous Tissue Debridement with a total area of 4.05 sq cm performed by Kalman Shan, DO. With the following instrument(s): Curette to remove Viable and Non-Viable tissue/material. Material removed includes Callus, Subcutaneous Tissue, Skin: Dermis, and Skin: Epidermis. A time out was conducted at 10:55, prior to the start of the procedure. A Minimum amount of bleeding was controlled with Pressure. The procedure was tolerated well with a pain level of 0 throughout and a pain level of 0 following the procedure. Post Debridement Measurements: 2.7cm length x 1.5cm width x 0.2cm depth; 0.636cm^3 volume. Post debridement Stage noted as Category/Stage III. Character of Wound/Ulcer Post Debridement is improved. Post procedure Diagnosis  Wound #2: Same as Pre-Procedure Plan Follow-up Appointments: Return appointment in 1 month. - w/ Dr. Heber [place] and [place] 1100 12/15/2022 Room 8 Other: - [place] to refill topical antibiotics. Off-Loading: Roho cushion for wheelchair - continue to use. Turn and reposition every 2 hours WOUND #2: - Sacrum Wound Laterality: Cleanser: Soap and Water 1 x Per Day/30 Days Discharge Instructions: May shower and wash wound with dial antibacterial soap and water prior to dressing change. Cleanser: Wound Cleanser (Generic) 1 x Per Day/30 Days Discharge Instructions: Cleanse the wound with wound cleanser prior to applying a clean dressing using gauze sponges, not tissue or cotton balls. Peri-Wound Care: Skin Prep (DME) (Generic) 1 x Per Day/30 Days Discharge Instructions: Use skin prep as directed Topical: Keystone (DME) (Generic) 1 x Per Day/30 Days Prim Dressing: AquacelAg Advantage Dressing, 4x5 (in/in) (DME) (Generic) 1 x Per Day/30 Days ary Secondary Dressing: Woven Gauze Sponge, Non-Sterile 4x4 in (Generic) 1 x Per Day/30 Days Discharge Instructions: Apply over primary dressing as directed. Secondary  Dressing: Zetuvit Plus Silicone Border Dressing 5x5 (in/in) (DME) (Generic) 1 x Per Day/30 Days Discharge Instructions: Apply silicone border over primary dressing as directed. Secured With: 41M Medipore H Soft Cloth Surgical T ape, 4 x 10 (in/yd) (Generic) 1 x Per Day/30 Days Discharge Instructions: Secure with tape as directed. OTT, ZIMMERLE ([id number]) [id number] Page 7 of 9 1. In office sharp debridement 2. Aquacel Ag with antibiotic ointment 3. Follow-up in 1 month Electronic Signature(s) Signed: 11/17/2022 1:45:23 PM By: Kalman Shan DO Entered By: Kalman Shan on 11/17/2022 11:25:08 -------------------------------------------------------------------------------- HxROS Details Patient Name: Date of Service: Jon Wells, [place] [name] 11/17/2022 10:15 A M Medical Record Number: [id number] Patient Account Number: [id number] Date of Birth/Sex: Treating RN: [birth year] (67 y.o. M) Primary Care Provider: Serita Grammes Other Clinician: Referring Provider: Treating Provider/Extender: [name] in Treatment: 20 Information Obtained From Patient Eyes Medical History: Negative for: Cataracts; Glaucoma; Optic Neuritis Ear/Nose/Mouth/Throat Medical History: Negative for: Chronic sinus problems/congestion; Middle ear problems Hematologic/Lymphatic Medical History: Negative for: Anemia; Hemophilia; Human Immunodeficiency Virus; Lymphedema; Sickle Cell Disease Respiratory Medical History: Negative for: Aspiration; Asthma; Chronic Obstructive Pulmonary Disease (COPD); Pneumothorax; Sleep Apnea; Tuberculosis Cardiovascular Medical History: Positive for: Hypertension Negative for: Angina; Arrhythmia; Congestive Heart Failure; Coronary Artery Disease; Deep Vein Thrombosis; Hypotension; Myocardial Infarction; Peripheral Arterial Disease; Peripheral Venous Disease; Phlebitis; Vasculitis Gastrointestinal Medical History: Negative for:  Cirrhosis ; Colitis; Crohns; Hepatitis A; Hepatitis B; Hepatitis C Endocrine Medical History: Negative for: Type I Diabetes; Type II Diabetes Genitourinary Medical History: Negative for: End Stage Renal Disease Past Medical History Notes: self cath Immunological Medical History: Negative for: Lupus Erythematosus; Raynauds; Scleroderma AQUILA, DELAUGHTER ([id number]) [id number] Page 8 of 9 Integumentary (Skin) Medical History: Negative for: History of Burn Musculoskeletal Medical History: Negative for: Gout; Rheumatoid Arthritis; Osteoarthritis; Osteomyelitis Neurologic Medical History: Positive for: Paraplegia - T12 Negative for: Dementia; Neuropathy; Quadriplegia; Seizure Disorder Oncologic Medical History: Negative for: Received Chemotherapy; Received Radiation Psychiatric Medical History: Negative for: Anorexia/bulimia; Confinement Anxiety Immunizations Pneumococcal Vaccine: Received Pneumococcal Vaccination: No Implantable Devices No devices added Hospitalization / Surgery History Type of Hospitalization/Surgery back surgery Family and Social History Cancer: No; Diabetes: Yes - Mother; Heart Disease: No; Hereditary Spherocytosis: No; Hypertension: Yes - Mother,Father; Kidney Disease: Yes - Mother; Lung Disease: No; Seizures: No; Stroke: No; Thyroid Problems: No; Tuberculosis: No; Never smoker; Marital Status - Married; Alcohol Use: Rarely; Drug Use: No History; Caffeine Use: Daily - coffee; Financial Concerns: No; Food, Clothing  or Shelter Needs: No; Support System Lacking: No; Transportation Concerns: No Electronic Signature(s) Signed: 11/17/2022 1:45:23 PM By: Kalman Shan DO Entered By: Kalman Shan on 11/17/2022 11:22:36 -------------------------------------------------------------------------------- SuperBill Details Patient Name: Date of Service: Jon Wells [name] 11/17/2022 Medical Record Number: [id number] Patient Account Number:  [id number] Date of Birth/Sex: Treating RN: 02/19/1956 (67 y.o. Hessie Diener Primary Care Provider: Serita Grammes Other Clinician: Referring Provider: Treating Provider/Extender: [name] in Treatment: 20 Diagnosis Coding ICD-10 Codes Code Description 4072028480 Pressure ulcer of sacral region, stage 3 S24.114D Complete lesion at T11-T12 level of thoracic spinal cord, subsequent encounter Facility Procedures : ODIE, EDMONDS Code: MES E ([id number]) [id number] IC L Description: [id number] - DEB SUBQ TISSUE 20 SQ CM/< D-10 Diagnosis Description 89.153 Pressure ulcer of sacral region, stage 3 Modifier: _Physician_[id number].pdf 1 Quantity: Page 9 of 9 Physician Procedures : CPT4 Code Description Modifier [id number] - WC PHYS SUBQ TISS 20 SQ CM ICD-10 Diagnosis Description L89.153 Pressure ulcer of sacral region, stage 3 Quantity: 1 Electronic Signature(s) Signed: 11/17/2022 1:45:23 PM By: Kalman Shan DO Entered By: Kalman Shan on 11/17/2022 11:25:16

## 2022-11-18 NOTE — Progress Notes (Signed)
Jon Wells, Jon Wells ([id number]) [id number] Page 1 of 7 Visit Report for 11/17/2022 Arrival Information Details Patient Name: Date of Service: [place] [name] 11/17/2022 10:15 A M Medical Record Number: [id number] Patient Account Number: [id number] Date of Birth/Sex: Treating RN: 1956/04/01 (67 y.o. M) Primary Care Shahira Fiske: Serita Grammes Other Clinician: Referring Kanin Lia: Treating Linken Mcglothen/Extender: [name] in Treatment: 59 Visit Information History Since Last Visit Added or deleted any medications: No Patient Arrived: Wheel Chair Any new allergies or adverse reactions: No Arrival Time: 10:26 Had a fall or experienced change in No Accompanied By: self activities of daily living that may affect Transfer Assistance: None risk of falls: Patient Identification Verified: Yes Signs or symptoms of abuse/neglect since last visito No Secondary Verification Process Completed: Yes Hospitalized since last visit: No Patient Requires Transmission-Based Precautions: No Implantable device outside of the clinic excluding No Patient Has Alerts: No cellular tissue based products placed in the center since last visit: Has Dressing in Place as Prescribed: Yes Pain Present Now: No Electronic Signature(s) Signed: 11/18/2022 12:35:45 PM By: Erenest Blank Entered By: Erenest Blank on 11/17/2022 10:27:50 -------------------------------------------------------------------------------- Encounter Discharge Information Details Patient Name: Date of Service: Jon Wells [name] 11/17/2022 10:15 A M Medical Record Number: [id number] Patient Account Number: [id number] Date of Birth/Sex: Treating RN: 01/23/1956 (67 y.o. Hessie Diener Primary Care Shaneya Taketa: Serita Grammes Other Clinician: Referring Evia Goldsmith: Treating Corin Formisano/Extender: [name] in Treatment: 20 Encounter Discharge Information Items Post Procedure  Vitals Discharge Condition: Stable Temperature (F): 98.7 Ambulatory Status: Wheelchair Pulse (bpm): 80 Discharge Destination: Home Respiratory Rate (breaths/min): 20 Transportation: Private Auto Blood Pressure (mmHg): 132/85 Accompanied By: self Schedule Follow-up Appointment: Yes Clinical Summary of Care: Electronic Signature(s) Signed: 11/17/2022 5:30:52 PM By: Deon Pilling RN, BSN Entered By: Deon Pilling on 11/17/2022 11:03:39 Jon Wells, Jon Wells ([id number]) [id number] Page 2 of 7 -------------------------------------------------------------------------------- Lower Extremity Assessment Details Patient Name: Date of Service: Jon Wells [name] 11/17/2022 10:15 A M Medical Record Number: [id number] Patient Account Number: [id number] Date of Birth/Sex: Treating RN: [birth year] (67 y.o. M) Primary Care Benecio Kluger: Serita Grammes Other Clinician: Referring Matyas Baisley: Treating Knoah Nedeau/Extender: [name] in Treatment: 20 Electronic Signature(s) Signed: 11/18/2022 12:35:45 PM By: Erenest Blank Entered By: Erenest Blank on 11/17/2022 10:28:53 -------------------------------------------------------------------------------- Multi Wound Chart Details Patient Name: Date of Service: Jon Wells [name] 11/17/2022 10:15 A M Medical Record Number: [id number] Patient Account Number: [id number] Date of Birth/Sex: Treating RN: 08/16/1956 (67 y.o. M) Primary Care Jaecob Lowden: Serita Grammes Other Clinician: Referring Ivie Savitt: Treating Breezie Micucci/Extender: [name] in Treatment: 20 Vital Signs Height(in): Pulse(bpm): 80 Weight(lbs): Blood Pressure(mmHg): 132/85 Body Mass Index(BMI): Temperature(F): 98.7 Respiratory Rate(breaths/min): 18 [2:Photos:] [N/A:N/A] Sacrum N/A N/A Wound Location: Pressure Injury N/A N/A Wounding Event: Pressure Ulcer N/A N/A Primary Etiology: Hypertension, Paraplegia N/A  N/A Comorbid History: 03/17/2022 N/A N/A Date Acquired: 20 N/A N/A Weeks of Treatment: Open N/A N/A Wound Status: No N/A N/A Wound Recurrence: 2.7x1.5x0.2 N/A N/A Measurements L x W x D (cm) 3.181 N/A N/A A (cm) : rea 0.636 N/A N/A Volume (cm) : 70.50% N/A N/A % Reduction in A rea: 80.40% N/A N/A % Reduction in Volume: Category/Stage III N/A N/A Classification: Medium N/A N/A Exudate A mount: Serosanguineous N/A N/A Exudate Type: red, brown N/A N/A Exudate Color: Distinct, outline attached N/A N/A Wound Margin: Large (67-100%) N/A N/A Granulation A mount: Red, Pink N/A N/A Granulation Quality: None Present (0%) N/A N/A Necrotic A  mount: Fat Layer (Subcutaneous Tissue): Yes N/A N/A Exposed Structures: FRANTZ, Jon Wells ([id number]) [id number].pdf Page 3 of 7 Jon Wells: No Tendon: No Muscle: No Joint: No Bone: No Large (67-100%) N/A N/A Epithelialization: Debridement - Excisional N/A N/A Debridement: Pre-procedure Verification/Time Out 10:55 N/A N/A Taken: Callus, Subcutaneous N/A N/A Tissue Debrided: Skin/Subcutaneous Tissue N/A N/A Level: 4.05 N/A N/A Debridement A (sq cm): rea Curette N/A N/A Instrument: Minimum N/A N/A Bleeding: Pressure N/A N/A Hemostasis A chieved: 0 N/A N/A Procedural Pain: 0 N/A N/A Post Procedural Pain: Procedure was tolerated well N/A N/A Debridement Treatment Response: 2.7x1.5x0.2 N/A N/A Post Debridement Measurements L x W x D (cm) 0.636 N/A N/A Post Debridement Volume: (cm) Category/Stage III N/A N/A Post Debridement Stage: Excoriation: No N/A N/A Periwound Skin Texture: Induration: No Callus: No Crepitus: No Rash: No Scarring: No Maceration: No N/A N/A Periwound Skin Moisture: Dry/Scaly: No Atrophie Blanche: No N/A N/A Periwound Skin Color: Cyanosis: No Ecchymosis: No Erythema: No Hemosiderin Staining: No Mottled: No Pallor: No Rubor: No No Abnormality N/A  N/A Temperature: Debridement N/A N/A Procedures Performed: Treatment Notes Wound #2 (Sacrum) Cleanser Soap and Water Discharge Instruction: May shower and wash wound with dial antibacterial soap and water prior to dressing change. Wound Cleanser Discharge Instruction: Cleanse the wound with wound cleanser prior to applying a clean dressing using gauze sponges, not tissue or cotton balls. Peri-Wound Care Skin Prep Discharge Instruction: Use skin prep as directed Topical Keystone Primary Dressing AquacelAg Advantage Dressing, 4x5 (in/in) Secondary Dressing Woven Gauze Sponge, Non-Sterile 4x4 in Discharge Instruction: Apply over primary dressing as directed. Zetuvit Plus Silicone Border Dressing 5x5 (in/in) Discharge Instruction: Apply silicone border over primary dressing as directed. Secured With 42M Medipore H Soft Cloth Surgical T ape, 4 x 10 (in/yd) Discharge Instruction: Secure with tape as directed. Compression Wrap Compression Stockings Add-Ons Electronic Signature(s) Signed: 11/17/2022 1:45:23 PM By: Renato Shin ([id number]) [id number] Page 4 of 7 Entered By: Kalman Shan on 11/17/2022 11:22:03 -------------------------------------------------------------------------------- Multi-Disciplinary Care Plan Details Patient Name: Date of Service: Jon Wells [name] 11/17/2022 10:15 A M Medical Record Number: [id number] Patient Account Number: [id number] Date of Birth/Sex: Treating RN: January 04, 1956 (67 y.o. Hessie Diener Primary Care Sanyah Molnar: Serita Grammes Other Clinician: Referring Chasity Outten: Treating Dao Mearns/Extender: [name] in Treatment: 20 Active Inactive Pressure Nursing Diagnoses: Knowledge deficit related to management of pressures ulcers Goals: Patient will remain free of pressure ulcers Date Initiated: 11/17/2022 Target Resolution Date: 12/23/2022 Goal Status:  Active Interventions: Assess offloading mechanisms upon admission and as needed Provide education on pressure ulcers Treatment Activities: Pressure reduction/relief device ordered : 11/17/2022 Notes: Electronic Signature(s) Signed: 11/17/2022 5:30:52 PM By: Deon Pilling RN, BSN Entered By: Deon Pilling on 11/17/2022 11:02:32 -------------------------------------------------------------------------------- Pain Assessment Details Patient Name: Date of Service: Jon Wells [name] 11/17/2022 10:15 A M Medical Record Number: [id number] Patient Account Number: [id number] Date of Birth/Sex: Treating RN: Jun 12, 1956 (67 y.o. M) Primary Care Keri Tavella: Serita Grammes Other Clinician: Referring Jessah Danser: Treating Aamani Moose/Extender: [name] in Treatment: 20 Active Problems Location of Pain Severity and Description of Pain Patient Has Paino No Site Locations DEMTRIUS, ROUNDS ([id number]) [id number] Page 5 of 7 Pain Management and Medication Current Pain Management: Electronic Signature(s) Signed: 11/18/2022 12:35:45 PM By: Erenest Blank Entered By: Erenest Blank on 11/17/2022 10:28:43 -------------------------------------------------------------------------------- Patient/Caregiver Education Details Patient Name: Date of Service: Jon Wells [name] 2/1/2024andnbsp10:15 A M Medical Record Number: [id number] Patient Account Number: [id number] Date of  Birth/Gender: Treating RN: [birth year] (67 y.o. Hessie Diener Primary Care Physician: Serita Grammes Other Clinician: Referring Physician: Treating Physician/Extender: [name] in Treatment: 20 Education Assessment Education Provided To: Patient Education Topics Provided Wound/Skin Impairment: Handouts: Caring for Your Ulcer Methods: Explain/Verbal Responses: Reinforcements needed Electronic Signature(s) Signed: 11/17/2022 5:30:52 PM By: Deon Pilling RN,  BSN Entered By: Deon Pilling on 11/17/2022 11:02:45 -------------------------------------------------------------------------------- Wound Assessment Details Patient Name: Date of Service: Jon Wells [name] 11/17/2022 10:15 A M Medical Record Number: [id number] Patient Account Number: [id number] Date of Birth/Sex: Treating RN: 10-19-1955 (67 y.o. M) Primary Care Lizet Kelso: Serita Grammes Other Clinician: ELIU, BATCH ([id number]) [id number] Page 6 of 7 Referring Havoc Sanluis: Treating Merleen Picazo/Extender: [name] in Treatment: 20 Wound Status Wound Number: 2 Primary Etiology: Pressure Ulcer Wound Location: Sacrum Wound Status: Open Wounding Event: Pressure Injury Comorbid History: Hypertension, Paraplegia Date Acquired: 03/17/2022 Weeks Of Treatment: 20 Clustered Wound: No Photos Wound Measurements Length: (cm) 2.7 Width: (cm) 1.5 Depth: (cm) 0.2 Area: (cm) 3.181 Volume: (cm) 0.636 % Reduction in Area: 70.5% % Reduction in Volume: 80.4% Epithelialization: Large (67-100%) Tunneling: No Undermining: No Wound Description Classification: Category/Stage III Wound Margin: Distinct, outline attached Exudate Amount: Medium Exudate Type: Serosanguineous Exudate Color: red, brown Foul Odor After Cleansing: No Slough/Fibrino Yes Wound Bed Granulation Amount: Large (67-100%) Exposed Structure Granulation Quality: Red, Pink Jon Wells Exposed: No Necrotic Amount: None Present (0%) Fat Layer (Subcutaneous Tissue) Exposed: Yes Tendon Exposed: No Muscle Exposed: No Joint Exposed: No Bone Exposed: No Periwound Skin Texture Texture Color No Abnormalities Noted: No No Abnormalities Noted: No Callus: No Atrophie Blanche: No Crepitus: No Cyanosis: No Excoriation: No Ecchymosis: No Induration: No Erythema: No Rash: No Hemosiderin Staining: No Scarring: No Mottled: No Pallor: No Moisture Rubor: No No Abnormalities  Noted: No Dry / Scaly: No Temperature / Pain Maceration: No Temperature: No Abnormality Treatment Notes Wound #2 (Sacrum) Cleanser Soap and Water Discharge Instruction: May shower and wash wound with dial antibacterial soap and water prior to dressing change. Wound Cleanser Discharge Instruction: Cleanse the wound with wound cleanser prior to applying a clean dressing using gauze sponges, not tissue or cotton balls. Peri-Wound Care KEYVON, HERTER ([id number]) [id number] Page 7 of 7 Skin Prep Discharge Instruction: Use skin prep as directed Topical Keystone Primary Dressing AquacelAg Advantage Dressing, 4x5 (in/in) Secondary Dressing Woven Gauze Sponge, Non-Sterile 4x4 in Discharge Instruction: Apply over primary dressing as directed. Zetuvit Plus Silicone Border Dressing 5x5 (in/in) Discharge Instruction: Apply silicone border over primary dressing as directed. Secured With 69M Medipore H Soft Cloth Surgical T ape, 4 x 10 (in/yd) Discharge Instruction: Secure with tape as directed. Compression Wrap Compression Stockings Add-Ons Electronic Signature(s) Signed: 11/18/2022 12:35:45 PM By: Erenest Blank Entered By: Erenest Blank on 11/17/2022 10:38:30 -------------------------------------------------------------------------------- Vitals Details Patient Name: Date of Service: Jon Wells [name] 11/17/2022 10:15 A M Medical Record Number: [id number] Patient Account Number: [id number] Date of Birth/Sex: Treating RN: 1956-06-29 (67 y.o. M) Primary Care Kirk Sampley: Serita Grammes Other Clinician: Referring Samera Macy: Treating Oneil Behney/Extender: [name] in Treatment: 20 Vital Signs Time Taken: 10:27 Temperature (F): 98.7 Pulse (bpm): 80 Respiratory Rate (breaths/min): 18 Blood Pressure (mmHg): 132/85 Reference Range: 80 - 120 mg / dl Electronic Signature(s) Signed: 11/18/2022 12:35:45 PM By: Erenest Blank Entered By:  Erenest Blank on 11/17/2022 42:68:34

## 2022-12-15 ENCOUNTER — Encounter (HOSPITAL_BASED_OUTPATIENT_CLINIC_OR_DEPARTMENT_OTHER): Payer: Medicare HMO | Admitting: Internal Medicine

## 2022-12-15 DIAGNOSIS — L89153 Pressure ulcer of sacral region, stage 3: Secondary | ICD-10-CM | POA: Diagnosis not present

## 2022-12-17 NOTE — Progress Notes (Signed)
NAFTULY, LYGA ([id number]) [id number] Page 1 of 8 Visit Report for 12/15/2022 Chief Complaint Document Details Patient Name: Date of Service: Jon Wells [name] 12/15/2022 11:00 A M Medical Record Number: [id number] Patient Account Number: [id number] Date of Birth/Sex: Treating RN: 1956-01-19 (67 y.o. M) Primary Care Provider: Serita Grammes Other Clinician: Referring Provider: Treating Provider/Extender: [name] in Treatment: 24 Information Obtained from: Patient Chief Complaint 01/09/18; patient is here for a second opinion with regards to a pressure area on the lower sacrum/coccyx 06/30/2022; patient returns to clinic for again a pressure ulcer on the lower sacrum/coccyx Electronic Signature(s) Signed: 12/15/2022 12:51:55 PM By: Kalman Shan DO Entered By: Kalman Shan on 12/15/2022 12:42:40 -------------------------------------------------------------------------------- Debridement Details Patient Name: Date of Service: [name] [name] 12/15/2022 11:00 A M Medical Record Number: [id number] Patient Account Number: [id number] Date of Birth/Sex: Treating RN: [birth year] (67 y.o. Jon Wells, Jon Wells Primary Care Provider: Serita Grammes Other Clinician: Referring Provider: Treating Provider/Extender: [name] in Treatment: 24 Debridement Performed for Assessment: Wound #2 Sacrum Performed By: Physician Kalman Shan, DO Debridement Type: Debridement Level of Consciousness (Pre-procedure): Awake and Alert Pre-procedure Verification/Time Out Yes - 11:40 Taken: Start Time: 11:40 Pain Control: Lidocaine T Area Debrided (L x W): otal 2.7 (cm) x 2.2 (cm) = 5.94 (cm) Tissue and other material debrided: Viable, Non-Viable, Callus, Slough, Subcutaneous, Slough Level: Skin/Subcutaneous Tissue Debridement Description: Excisional Instrument: Curette Bleeding: Minimum Hemostasis  Achieved: Pressure End Time: 11:40 Procedural Pain: 0 Post Procedural Pain: 0 Response to Treatment: Procedure was tolerated well Level of Consciousness (Post- Awake and Alert procedure): Post Debridement Measurements of Total Wound Length: (cm) 2.7 Stage: Category/Stage III Width: (cm) 2.2 Depth: (cm) 0.2 Volume: (cm) 0.933 Character of Wound/Ulcer Post Debridement: Improved Post Procedure Diagnosis Same as Pre-procedure Electronic Signature(s) Signed: 12/15/2022 12:51:55 PM By: Kalman Shan DO Signed: 12/15/2022 4:19:23 PM By: Rhae Hammock RN Entered By: Rhae Hammock on 12/15/2022 11:39:56 Jon Wells, Jon Wells ([id number]) [id number] Page 2 of 8 -------------------------------------------------------------------------------- HPI Details Patient Name: Date of Service: [name] [name] 12/15/2022 11:00 A M Medical Record Number: [id number] Patient Account Number: [id number] Date of Birth/Sex: Treating RN: 12-15-1955 (67 y.o. 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 He is an active man has a wheelchair with a Roho cushion. Still active around the home. He is been battling an area on the lower sacrum/coccyx for about a year and according to him is been at the [facility] wound care center for 6-7 months. He didn't want [place] to call for records as he was largely here just for a second opinion and therefore I'm not completely certain what is been used to treat this wound. He is currently using calcium alginate. Has used Xeroform and apparently Prisma at least he appeared to recognize Prisma. He doesn't really have much in the way of additional medical issues. He is  hypertensive been only medication is lisinopril. He is not a diabetic. He does digital stimulation for bowel movements and in and out cath for voiding. 01/23/18; the patient arrives having cut a hole out of an old cushion for his wheelchair. This offloaded the area in question. He is also been using Endoform to the actual wounds. He arrives  today with the wounds less than half the size of last time. Really dramatic surprising improvement All capital readmission 06/30/2022 This is a 67 year old man who is now greater than 20 years post traumatic T12 lesion with resultant paralysis. We had them for 2 visits in 2019 for a wound in the same area I do not think he was discharged in a healed state. He tells me that the wound has varied in condition from time to time over the years most of the time and will gradually improve on its own however over the last 3 months or so he has developed the largest wound he has had and it just will not progress towards healing. He has been going to the wound care center in [place] and of course I have none of these notes. They apparently have had trouble getting a dressing to stay on in the area. In his wheelchair he has a Roho cushion with the coccyx area cut out. He tells me he is rigorous about offloading this in bed. Recently they cultured staph there and he has just completed antibiotics although were not sure which ones.He tells me that they could not get any dressing to adhere to the area. He is also concerned about continuous drainage Past medical history hypertension, T12 level paralysis. He has a neurogenic bladder and does in and out caths. His bowel movements via digital stimulation 9/22; patient presents for follow-up. He had a PCR culture done at last clinic visit that grew normal high levels of Enterococcus bacillus and coag negative staph. Keystone antibiotic ointment was ordered. Patient has not received this yet. He has been using silver alginate dressings.  He has no issues or complaints today. 10/6; patient presents for follow-up. He states he received Keystone antibiotic ointment and has been using this with silver alginate. He has no issues or complaints today. 11/30; patient was last seen in the beginning of October. He has missed his last clinic appointment. He states he has been using silver alginate to the wound bed. He has not been using Keystone antibiotic ointment. He currently denies signs of infection. It is unclear if he can offload this area very well. 12/21; patient presents for follow-up. He has been using Keystone antibiotic ointment with silver alginate. He has no issues or complaints today. 2/1; patient presents for follow-up. He has been using Keystone antibiotic ointment with Aquacel Ag. He states he ran out of the antibiotic ointment. On that he has no issues or complaints today. 2/29; patient presents for follow-up. Has been using Keystone antibiotic ointment with Aquacel Ag. He has no issues or complaints today. Electronic Signature(s) Signed: 12/15/2022 12:51:55 PM By: Kalman Shan DO Entered By: Kalman Shan on 12/15/2022 12:43:14 -------------------------------------------------------------------------------- Physical Exam Details Patient Name: Date of Service: [name] [name] 12/15/2022 11:00 A M Medical Record Number: [id number] Patient Account Number: [id number] Date of Birth/Sex: Treating RN: [birth year] (67 y.o. M) Primary Care Provider: Serita Grammes Other Clinician: Referring Provider: Treating Provider/Extender: [name] in Treatment: 24 Constitutional respirations regular, non-labored and within target range for patient.Marland Kitchen Psychiatric pleasant and cooperative. Notes T the lower part of the coccyx there is an open wound with rolled senescent edges with granulation tissue at the base and slough. Devitalized tissue to the o edges circumferentially. No signs of  surrounding infection. Jon Wells, Jon Wells ([id number]) [id number] Page 3 of 8 Electronic Signature(s) Signed: 12/15/2022 12:51:55 PM By: Kalman Shan DO Entered By: Kalman Shan on 12/15/2022 12:43:39 -------------------------------------------------------------------------------- Physician  Orders Details Patient Name: Date of Service: Jon Wells [name] 12/15/2022 11:00 A M Medical Record Number: [id number] Patient Account Number: [id number] Date of Birth/Sex: Treating RN: [birth year] (67 y.o. [name] Primary Care Provider: Serita Grammes Other Clinician: Referring Provider: Treating Provider/Extender: [name] in Treatment: 28 Verbal / Phone Orders: No Diagnosis Coding Follow-up Appointments Return appointment in 1 month. - w/ Dr. Heber [place] and Allayne Butcher Rm # 9 Other: - [place] to refill topical antibiotics. Off-Loading Roho cushion for wheelchair - continue to use. Turn and reposition every 2 hours Wound Treatment Wound #2 - Sacrum Cleanser: Soap and Water 1 x Per Day/30 Days Discharge Instructions: May shower and wash wound with dial antibacterial soap and water prior to dressing change. Cleanser: Wound Cleanser (Generic) 1 x Per Day/30 Days Discharge Instructions: Cleanse the wound with wound cleanser prior to applying a clean dressing using gauze sponges, not tissue or cotton balls. Peri-Wound Care: Skin Prep (Generic) 1 x Per Day/30 Days Discharge Instructions: Use skin prep as directed Topical: Keystone (Generic) 1 x Per Day/30 Days Prim Dressing: AquacelAg Advantage Dressing, 4x5 (in/in) (Generic) 1 x Per Day/30 Days ary Secondary Dressing: Woven Gauze Sponge, Non-Sterile 4x4 in (Generic) 1 x Per Day/30 Days Discharge Instructions: Apply over primary dressing as directed. Secondary Dressing: Zetuvit Plus Silicone Border Dressing 5x5 (in/in) (Generic) 1 x Per Day/30 Days Discharge  Instructions: Apply silicone border over primary dressing as directed. Secured With: 78M Medipore H Soft Cloth Surgical T ape, 4 x 10 (in/yd) (Generic) 1 x Per Day/30 Days Discharge Instructions: Secure with tape as directed. Electronic Signature(s) Signed: 12/15/2022 12:51:55 PM By: Kalman Shan DO Entered By: Kalman Shan on 12/15/2022 12:43:45 -------------------------------------------------------------------------------- Problem List Details Patient Name: Date of Service: [name] [name] 12/15/2022 11:00 A M Medical Record Number: [id number] Patient Account Number: [id number] Date of Birth/Sex: Treating RN: 05-21-1956 (67 y.o. M) Primary Care Provider: Serita Grammes Other Clinician: Referring Provider: Treating Provider/Extender: [name] in Treatment: 24 Active Problems ICD-10 Encounter Code Description Active Date MDM Diagnosis BRAILON, GOJCAJ ([id number]) [phone] Page 4 of 8 L89.153 Pressure ulcer of sacral region, stage 3 06/30/2022 No Yes S24.114D Complete lesion at T11-T12 level of thoracic spinal cord, subsequent encounter 06/30/2022 No Yes Inactive Problems Resolved Problems Electronic Signature(s) Signed: 12/15/2022 12:51:55 PM By: Kalman Shan DO Entered By: Kalman Shan on 12/15/2022 12:42:19 -------------------------------------------------------------------------------- Progress Note Details Patient Name: Date of Service: [name] [name] 12/15/2022 11:00 A M Medical Record Number: [id number] Patient Account Number: [id number] Date of Birth/Sex: Treating RN: 09/14/1956 (67 y.o. 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 He is an active man has a wheelchair with a Roho cushion. Still active around the home. He is been battling an area on the lower sacrum/coccyx for about a year and according to him is been at the [facility] wound care center for 6-7 months. He didn't want [place] to call for records as he was largely here just  for a second opinion and therefore I'm not completely certain what is been used to treat this wound. He is currently using calcium alginate. Has used Xeroform and apparently Prisma at least he appeared to recognize Prisma. He doesn't really have much in the way of additional medical issues. He is hypertensive been only medication is lisinopril. He is not a diabetic. He does digital stimulation for bowel movements and in and out cath for voiding. 01/23/18; the patient arrives having cut a hole out of an old cushion for his wheelchair. This offloaded the area in question. He is also been using Endoform to the actual wounds. He arrives today with the wounds less than half the size of last time. Really dramatic surprising improvement All capital readmission 06/30/2022 This is a 67 year old man who is now greater than 20 years post traumatic T12 lesion with resultant paralysis. We had them for 2 visits in 2019 for a wound in the same area I do not think he was discharged in a healed state. He tells me that the wound has varied in condition from time to time over the years most of the time and will gradually improve on its own however over the last 3 months or so he has developed the largest wound he has had and it just will not progress towards healing. He has been going to the wound care center in [place] and of course I have none of these  notes. They apparently have had trouble getting a dressing to stay on in the area. In his wheelchair he has a Roho cushion with the coccyx area cut out. He tells me he is rigorous about offloading this in bed. Recently they cultured staph there and he has just completed antibiotics although were not sure which ones.He tells me that they could not get any dressing to adhere to the area. He is also concerned about continuous drainage Past medical history hypertension, T12 level paralysis. He has a neurogenic bladder and does in and out caths. His bowel movements via digital stimulation 9/22; patient presents for follow-up. He had a PCR culture done at last clinic visit that grew normal high levels of Enterococcus bacillus and coag negative staph. Keystone antibiotic ointment was ordered. Patient has not received this yet. He has been using silver alginate dressings. He has no issues or complaints today. 10/6; patient presents for follow-up. He states he received Keystone antibiotic ointment and has been using this with silver alginate. He has no issues or complaints today. 11/30; patient was last seen in the beginning of October. He has missed his last clinic appointment. He states he has been using silver alginate to the wound bed. He has not been using Keystone antibiotic ointment. He currently denies signs of infection. It is unclear if he can offload this area very well. 12/21; patient presents for follow-up. He has been using Keystone antibiotic ointment with silver alginate. He has no issues or complaints today. 2/1; patient presents for follow-up. He has been using Keystone antibiotic ointment with Aquacel Ag. He states he ran out of the antibiotic ointment. On that he has no issues or complaints today. 2/29; patient presents for follow-up. Has been using Keystone antibiotic ointment with Aquacel Ag. He has no issues or complaints today. Patient History Information obtained from  Patient. Family History Diabetes - Mother, Hypertension - Mother,Father, Kidney Disease - Mother, DARRICK, SALVA ([id number]) [phone].pdf Page 5 of 8 No family history of Cancer, Heart Disease, Hereditary Spherocytosis, Lung Disease, Seizures, Stroke,  Thyroid Problems, Tuberculosis. Social History Never smoker, Marital Status - Married, Alcohol Use - Rarely, Drug Use - No History, Caffeine Use - Daily - coffee. Medical History Eyes Denies history of Cataracts, Glaucoma, Optic Neuritis Ear/Nose/Mouth/Throat Denies history of Chronic sinus problems/congestion, Middle ear problems Hematologic/Lymphatic Denies history of Anemia, Hemophilia, Human Immunodeficiency Virus, Lymphedema, Sickle Cell Disease Respiratory Denies history of Aspiration, Asthma, Chronic Obstructive Pulmonary Disease (COPD), Pneumothorax, Sleep Apnea, Tuberculosis Cardiovascular Patient has history of Hypertension Denies history of Angina, Arrhythmia, Congestive Heart Failure, Coronary Artery Disease, Deep Vein Thrombosis, Hypotension, Myocardial Infarction, Peripheral Arterial Disease, Peripheral Venous Disease, Phlebitis, Vasculitis Gastrointestinal Denies history of Cirrhosis , Colitis, Crohnoos, Hepatitis A, Hepatitis B, Hepatitis C Endocrine Denies history of Type I Diabetes, Type II Diabetes Genitourinary Denies history of End Stage Renal Disease Immunological Denies history of Lupus Erythematosus, Raynaudoos, Scleroderma Integumentary (Skin) Denies history of History of Burn Musculoskeletal Denies history of Gout, Rheumatoid Arthritis, Osteoarthritis, Osteomyelitis Neurologic Patient has history of Paraplegia - T12 Denies history of Dementia, Neuropathy, Quadriplegia, Seizure Disorder Oncologic Denies history of Received Chemotherapy, Received Radiation Psychiatric Denies history of Anorexia/bulimia, Confinement Anxiety Hospitalization/Surgery History - back surgery. Medical  A Surgical History Notes nd Genitourinary self cath Objective Constitutional respirations regular, non-labored and within target range for patient.. Vitals Time Taken: 11:09 AM, Temperature: 98.7 F, Pulse: 67 bpm, Respiratory Rate: 17 breaths/min, Blood Pressure: 157/83 mmHg. Psychiatric pleasant and cooperative. General Notes: T the lower part of the coccyx there is an open wound with rolled senescent edges with granulation tissue at the base and slough. Devitalized o tissue to the edges circumferentially. No signs of surrounding infection. Integumentary (Hair, Skin) Wound #2 status is Open. Original cause of wound was Pressure Injury. The date acquired was: 03/17/2022. The wound has been in treatment 24 weeks. The wound is located on the Sacrum. The wound measures 2.7cm length x 2.2cm width x 0.2cm depth; 4.665cm^2 area and 0.933cm^3 volume. There is Fat Layer (Subcutaneous Tissue) exposed. There is a medium amount of serosanguineous drainage noted. The wound margin is distinct with the outline attached to the wound base. There is large (67-100%) red, pink, hyper - granulation within the wound bed. There is no necrotic tissue within the wound bed. The periwound skin appearance did not exhibit: Callus, Crepitus, Excoriation, Induration, Rash, Scarring, Dry/Scaly, Maceration, Atrophie Blanche, Cyanosis, Ecchymosis, Hemosiderin Staining, Mottled, Pallor, Rubor, Erythema. Periwound temperature was noted as No Abnormality. Assessment Active Problems ICD-10 Pressure ulcer of sacral region, stage 3 Complete lesion at T11-T12 level of thoracic spinal cord, subsequent encounter Jon Wells, Jon Wells ([id number]) [phone].pdf Page 6 of 8 Today patient's wound appears better than last clinic visit with more granulation tissue and less nonviable tissue. I recommended continuing the course with aggressive offloading, antibiotic ointment and silver alginate. Follow-up in 1  month. Procedures Wound #2 Pre-procedure diagnosis of Wound #2 is a Pressure Ulcer located on the Sacrum . There was a Excisional Skin/Subcutaneous Tissue Debridement with a total area of 5.94 sq cm performed by Kalman Shan, DO. With the following instrument(s): Curette to remove Viable and Non-Viable tissue/material. Material removed includes Callus, Subcutaneous Tissue, and Slough after achieving pain control using Lidocaine. No specimens were taken. A time out was conducted at 11:40, prior to the start of the procedure. A Minimum amount of bleeding was controlled with Pressure. The procedure was tolerated well with a pain level of 0 throughout and a pain level of 0 following the procedure. Post Debridement Measurements: 2.7cm length x 2.2cm width x 0.2cm  depth; 0.933cm^3 volume. Post debridement Stage noted as Category/Stage III. Character of Wound/Ulcer Post Debridement is improved. Post procedure Diagnosis Wound #2: Same as Pre-Procedure Plan Follow-up Appointments: Return appointment in 1 month. - w/ Dr. Heber [place] and Allayne Butcher Rm # 9 Other: - [place] to refill topical antibiotics. Off-Loading: Roho cushion for wheelchair - continue to use. Turn and reposition every 2 hours WOUND #2: - Sacrum Wound Laterality: Cleanser: Soap and Water 1 x Per Day/30 Days Discharge Instructions: May shower and wash wound with dial antibacterial soap and water prior to dressing change. Cleanser: Wound Cleanser (Generic) 1 x Per Day/30 Days Discharge Instructions: Cleanse the wound with wound cleanser prior to applying a clean dressing using gauze sponges, not tissue or cotton balls. Peri-Wound Care: Skin Prep (Generic) 1 x Per Day/30 Days Discharge Instructions: Use skin prep as directed Topical: Keystone (Generic) 1 x Per Day/30 Days Prim Dressing: AquacelAg Advantage Dressing, 4x5 (in/in) (Generic) 1 x Per Day/30 Days ary Secondary Dressing: Woven Gauze Sponge, Non-Sterile 4x4  in (Generic) 1 x Per Day/30 Days Discharge Instructions: Apply over primary dressing as directed. Secondary Dressing: Zetuvit Plus Silicone Border Dressing 5x5 (in/in) (Generic) 1 x Per Day/30 Days Discharge Instructions: Apply silicone border over primary dressing as directed. Secured With: 37M Medipore H Soft Cloth Surgical T ape, 4 x 10 (in/yd) (Generic) 1 x Per Day/30 Days Discharge Instructions: Secure with tape as directed. 1. In office sharp debridement 2. Keystone antibiotic ointment with Aquacel Ag 3. Aggressive offloading 4. Follow-up in 1 month Electronic Signature(s) Signed: 12/15/2022 12:51:55 PM By: Kalman Shan DO Entered By: Kalman Shan on 12/15/2022 12:46:22 -------------------------------------------------------------------------------- HxROS Details Patient Name: Date of Service: [name] [name] 12/15/2022 11:00 A M Medical Record Number: [id number] Patient Account Number: [id number] Date of Birth/Sex: Treating RN: [birth year] (67 y.o. M) Primary Care Provider: Serita Grammes Other Clinician: Referring Provider: Treating Provider/Extender: [name] in Treatment: 24 Information Obtained From Patient Eyes Medical History: Negative for: Cataracts; Glaucoma; Optic Neuritis Ear/Nose/Mouth/Throat Jon Wells, Jon Wells ([id number]) [id number] Page 7 of 8 Medical History: Negative for: Chronic sinus problems/congestion; Middle ear problems Hematologic/Lymphatic Medical History: Negative for: Anemia; Hemophilia; Human Immunodeficiency Virus; Lymphedema; Sickle Cell Disease Respiratory Medical History: Negative for: Aspiration; Asthma; Chronic Obstructive Pulmonary Disease (COPD); Pneumothorax; Sleep Apnea; Tuberculosis Cardiovascular Medical History: Positive for: Hypertension Negative for: Angina; Arrhythmia; Congestive Heart Failure; Coronary Artery Disease; Deep Vein Thrombosis; Hypotension; Myocardial  Infarction; Peripheral Arterial Disease; Peripheral Venous Disease; Phlebitis; Vasculitis Gastrointestinal Medical History: Negative for: Cirrhosis ; Colitis; Crohns; Hepatitis A; Hepatitis B; Hepatitis C Endocrine Medical History: Negative for: Type I Diabetes; Type II Diabetes Genitourinary Medical History: Negative for: End Stage Renal Disease Past Medical History Notes: self cath Immunological Medical History: Negative for: Lupus Erythematosus; Raynauds; Scleroderma Integumentary (Skin) Medical History: Negative for: History of Burn Musculoskeletal Medical History: Negative for: Gout; Rheumatoid Arthritis; Osteoarthritis; Osteomyelitis Neurologic Medical History: Positive for: Paraplegia - T12 Negative for: Dementia; Neuropathy; Quadriplegia; Seizure Disorder Oncologic Medical History: Negative for: Received Chemotherapy; Received Radiation Psychiatric Medical History: Negative for: Anorexia/bulimia; Confinement Anxiety Immunizations Pneumococcal Vaccine: Received Pneumococcal Vaccination: No Implantable Devices No devices added Hospitalization / Surgery History Type of Hospitalization/Surgery Jon Wells, Jon Wells ([id number]) [id number].pdf Page 8 of 8 back surgery Family and Social History Cancer: No; Diabetes: Yes - Mother; Heart Disease: No; Hereditary Spherocytosis: No; Hypertension: Yes - Mother,Father; Kidney Disease: Yes - Mother; Lung Disease: No; Seizures: No; Stroke: No; Thyroid Problems: No; Tuberculosis: No; Never smoker; Marital Status -  Married; Alcohol Use: Rarely; Drug Use: No History; Caffeine Use: Daily - coffee; Financial Concerns: No; Food, Clothing or Shelter Needs: No; Support System Lacking: No; Transportation Concerns: No Electronic Signature(s) Signed: 12/15/2022 12:51:55 PM By: Kalman Shan DO Entered By: Kalman Shan on 12/15/2022  12:43:22 -------------------------------------------------------------------------------- SuperBill Details Patient Name: Date of Service: [name] [name] 12/15/2022 Medical Record Number: [id number] Patient Account Number: [id number] Date of Birth/Sex: Treating RN: 01-Dec-1955 (67 y.o. Jon Wells, Jon Wells Primary Care Provider: Serita Grammes Other Clinician: Referring Provider: Treating Provider/Extender: [name] in Treatment: 24 Diagnosis Coding ICD-10 Codes Code Description [phone] Pressure ulcer of sacral region, stage 3 S24.114D Complete lesion at T11-T12 level of thoracic spinal cord, subsequent encounter Facility Procedures : CPT4 Code: [id number] Description: [place] TISSUE 20 SQ CM/< ICD-10 Diagnosis Description L89.153 Pressure ulcer of sacral region, stage 3 Modifier: Quantity: 1 Physician Procedures : CPT4 Code Description Modifier [id number] - WC PHYS SUBQ TISS 20 SQ CM ICD-10 Diagnosis Description L89.153 Pressure ulcer of sacral region, stage 3 Quantity: 1 Electronic Signature(s) Signed: 12/15/2022 12:51:55 PM By: Kalman Shan DO Entered By: Kalman Shan on 12/15/2022 12:46:28

## 2022-12-17 NOTE — Progress Notes (Signed)
Jon, Wells ([id number]) [id number] Page 1 of 7 Visit Report for 12/15/2022 Arrival Information Details Patient Name: Date of Service: Jon Wells [name] 12/15/2022 11:00 A M Medical Record Number: [id number] Patient Account Number: [id number] Date of Birth/Sex: Treating RN: 02/12/1956 (67 y.o. Jon Wells, Jon Primary Care Bladyn Tipps: Serita Grammes Other Clinician: Referring Byron Peacock: Treating Hari Casaus/Extender: [name] in Treatment: 24 Visit Information History Since Last Visit Added or deleted any medications: No Patient Arrived: Wheel Chair Any new allergies or adverse reactions: No Arrival Time: 11:08 Had a fall or experienced change in No Accompanied By: self activities of daily living that may affect Transfer Assistance: Manual risk of falls: Patient Identification Verified: Yes Signs or symptoms of abuse/neglect since last visito No Secondary Verification Process Completed: Yes Hospitalized since last visit: No Patient Requires Transmission-Based Precautions: No Implantable device outside of the clinic excluding No Patient Has Alerts: No cellular tissue based products placed in the center since last visit: Has Dressing in Place as Prescribed: Yes Pain Present Now: No Electronic Signature(s) Signed: 12/15/2022 4:19:23 PM By: Rhae Hammock RN Entered By: Rhae Hammock on 12/15/2022 11:09:16 -------------------------------------------------------------------------------- Encounter Discharge Information Details Patient Name: Date of Service: Jon Wells [name] 12/15/2022 11:00 A M Medical Record Number: [id number] Patient Account Number: [id number] Date of Birth/Sex: Treating RN: [birth year] (67 y.o. Jon Wells, Jon Primary Care Lamarco Gudiel: Serita Grammes Other Clinician: Referring Mykal Batiz: Treating Audrey Eller/Extender: [name] in Treatment: 24 Encounter Discharge  Information Items Post Procedure Vitals Discharge Condition: Stable Temperature (F): 98.7 Ambulatory Status: Wheelchair Pulse (bpm): 74 Discharge Destination: Home Respiratory Rate (breaths/min): 17 Transportation: Private Auto Blood Pressure (mmHg): 133/82 Accompanied By: self Schedule Follow-up Appointment: Yes Clinical Summary of Care: Patient Declined Electronic Signature(s) Signed: 12/15/2022 4:19:23 PM By: Rhae Hammock RN Entered By: Rhae Hammock on 12/15/2022 11:40:39 -------------------------------------------------------------------------------- Lower Extremity Assessment Details Patient Name: Date of Service: Jon Wells [name] 12/15/2022 11:00 A M Medical Record Number: [id number] Patient Account Number: [id number] Date of Birth/Sex: Treating RN: 02-14-1956 (67 y.o. Jon Wells, Jon Primary Care Jon Wells: Serita Grammes Other Clinician: Referring Kamyiah Colantonio: Treating Sherlonda Flater/Extender: [name] in Treatment: 24 Electronic Signature(s) Signed: 12/15/2022 4:19:23 PM By: Rhae Hammock RN Tollie Eth ([id number]) [id number] Page 2 of 7 Entered By: Rhae Hammock on 12/15/2022 11:09:38 -------------------------------------------------------------------------------- Multi Wound Chart Details Patient Name: Date of Service: Jon Wells [name] 12/15/2022 11:00 A M Medical Record Number: [id number] Patient Account Number: [id number] Date of Birth/Sex: Treating RN: Feb 08, 1956 (67 y.o. M) Primary Care Jon Wells: Serita Grammes Other Clinician: Referring Huyen Perazzo: Treating Jon Wells/Extender: [name] in Treatment: 24 Vital Signs Height(in): Pulse(bpm): 23 Weight(lbs): Blood Pressure(mmHg): 157/83 Body Mass Index(BMI): Temperature(F): 98.7 Respiratory Rate(breaths/min): 17 [2:Photos:] [N/A:N/A] Sacrum N/A N/A Wound Location: Pressure Injury N/A N/A Wounding  Event: Pressure Ulcer N/A N/A Primary Etiology: Hypertension, Paraplegia N/A N/A Comorbid History: 03/17/2022 N/A N/A Date Acquired: 24 N/A N/A Weeks of Treatment: Open N/A N/A Wound Status: No N/A N/A Wound Recurrence: 2.7x2.2x0.2 N/A N/A Measurements L x W x D (cm) 4.665 N/A N/A A (cm) : rea 0.933 N/A N/A Volume (cm) : 56.80% N/A N/A % Reduction in A rea: 71.20% N/A N/A % Reduction in Volume: Category/Stage III N/A N/A Classification: Medium N/A N/A Exudate A mount: Serosanguineous N/A N/A Exudate Type: red, brown N/A N/A Exudate Color: Distinct, outline attached N/A N/A Wound Margin: Large (67-100%) N/A N/A Granulation A mount: Red, Pink, Hyper-granulation N/A N/A Granulation  Quality: None Present (0%) N/A N/A Necrotic A mount: Fat Layer (Subcutaneous Tissue): Yes N/A N/A Exposed Structures: Fascia: No Tendon: No Muscle: No Joint: No Bone: No Large (67-100%) N/A N/A Epithelialization: Debridement - Excisional N/A N/A Debridement: Pre-procedure Verification/Time Out 11:40 N/A N/A Taken: Lidocaine N/A N/A Pain Control: Callus, Subcutaneous, Slough N/A N/A Tissue Debrided: Skin/Subcutaneous Tissue N/A N/A Level: 5.94 N/A N/A Debridement A (sq cm): rea Curette N/A N/A Instrument: Minimum N/A N/A Bleeding: Pressure N/A N/A Hemostasis A chieved: 0 N/A N/A Procedural Pain: 0 N/A N/A Post Procedural Pain: Procedure was tolerated well N/A N/A Debridement Treatment Response: 2.7x2.2x0.2 N/A N/A Post Debridement Measurements L x W x D (cm) 0.933 N/A N/A Post Debridement Volume: (cm) Category/Stage III N/A N/A Post Debridement Stage: Excoriation: No N/A N/A Periwound Skin Texture: Induration: No Callus: No Crepitus: No Jon, Wells ([id number]) [phone].pdf Page 3 of 7 Rash: No Scarring: No Maceration: No N/A N/A Periwound Skin Moisture: Dry/Scaly: No Atrophie Blanche: No N/A N/A Periwound Skin  Color: Cyanosis: No Ecchymosis: No Erythema: No Hemosiderin Staining: No Mottled: No Pallor: No Rubor: No No Abnormality N/A N/A Temperature: Debridement N/A N/A Procedures Performed: Treatment Notes Wound #2 (Sacrum) Cleanser Soap and Water Discharge Instruction: May shower and wash wound with dial antibacterial soap and water prior to dressing change. Wound Cleanser Discharge Instruction: Cleanse the wound with wound cleanser prior to applying a clean dressing using gauze sponges, not tissue or cotton balls. Peri-Wound Care Skin Prep Discharge Instruction: Use skin prep as directed Topical Keystone Primary Dressing AquacelAg Advantage Dressing, 4x5 (in/in) Secondary Dressing Woven Gauze Sponge, Non-Sterile 4x4 in Discharge Instruction: Apply over primary dressing as directed. Zetuvit Plus Silicone Border Dressing 5x5 (in/in) Discharge Instruction: Apply silicone border over primary dressing as directed. Secured With 55M Medipore H Soft Cloth Surgical T ape, 4 x 10 (in/yd) Discharge Instruction: Secure with tape as directed. Compression Wrap Compression Stockings Add-Ons Electronic Signature(s) Signed: 12/15/2022 12:51:55 PM By: Kalman Shan DO Entered By: Kalman Shan on 12/15/2022 12:42:25 -------------------------------------------------------------------------------- Multi-Disciplinary Care Plan Details Patient Name: Date of Service: Jon Wells [name] 12/15/2022 11:00 A M Medical Record Number: [id number] Patient Account Number: [id number] Date of Birth/Sex: Treating RN: October 13, 1956 (67 y.o. [name] Primary Care Isamar Nazir: Serita Grammes Other Clinician: Referring Aleisha Paone: Treating Ilhan Madan/Extender: [name] in Treatment: 24 Active Inactive Pressure Nursing Diagnoses: Knowledge deficit related to management of pressures ulcers CLAUDIO, GOMBERG ([id number]) [phone].pdf Page 4 of  7 Goals: Patient will remain free of pressure ulcers Date Initiated: 11/17/2022 Target Resolution Date: 12/23/2022 Goal Status: Active Interventions: Assess offloading mechanisms upon admission and as needed Provide education on pressure ulcers Treatment Activities: Pressure reduction/relief device ordered : 11/17/2022 Notes: Electronic Signature(s) Signed: 12/15/2022 4:19:23 PM By: Rhae Hammock RN Entered By: Rhae Hammock on 12/15/2022 11:17:59 -------------------------------------------------------------------------------- Pain Assessment Details Patient Name: Date of Service: Jon Wells [name] 12/15/2022 11:00 A M Medical Record Number: [id number] Patient Account Number: [id number] Date of Birth/Sex: Treating RN: [birth year] (67 y.o. [name] Primary Care Abdulmalik Darco: Serita Grammes Other Clinician: Referring Layah Skousen: Treating Bennett Ram/Extender: [name] in Treatment: 24 Active Problems Location of Pain Severity and Description of Pain Patient Has Paino No Site Locations Pain Management and Medication Current Pain Management: Electronic Signature(s) Signed: 12/15/2022 4:19:23 PM By: Rhae Hammock RN Entered By: Rhae Hammock on 12/15/2022 11:09:34 -------------------------------------------------------------------------------- Patient/Caregiver Education Details Patient Name: Date of Service: Jon SS, JA [name] 2/29/2024andnbsp11:00 A M Medical Record Number:  [id number] Patient Account Number: [id number] Date of Birth/Gender: Treating RN: September 24, 1956 (68 y.o. [name] Primary Care Physician: Serita Grammes Other Clinician: Referring Physician: Treating Physician/Extender: [name] in Treatment: [address] LATHON, BRAGANZA ([id number]) [id number] Page 5 of 7 Education Provided To: Patient Education Topics Provided Wound/Skin  Impairment: Methods: Explain/Verbal Responses: Reinforcements needed, State content correctly Electronic Signature(s) Signed: 12/15/2022 4:19:23 PM By: Rhae Hammock RN Entered By: Rhae Hammock on 12/15/2022 11:18:10 -------------------------------------------------------------------------------- Wound Assessment Details Patient Name: Date of Service: Jon Wells [name] 12/15/2022 11:00 A M Medical Record Number: [id number] Patient Account Number: [id number] Date of Birth/Sex: Treating RN: June 24, 1956 (67 y.o. Jon Wells, Jon Primary Care Naftula Donahue: Serita Grammes Other Clinician: Referring Dayona Shaheen: Treating Pheonix Clinkscale/Extender: [name] in Treatment: 24 Wound Status Wound Number: 2 Primary Etiology: Pressure Ulcer Wound Location: Sacrum Wound Status: Open Wounding Event: Pressure Injury Comorbid History: Hypertension, Paraplegia Date Acquired: 03/17/2022 Weeks Of Treatment: 24 Clustered Wound: No Photos Wound Measurements Length: (cm) 2.7 Width: (cm) 2.2 Depth: (cm) 0.2 Area: (cm) 4.665 Volume: (cm) 0.933 % Reduction in Area: 56.8% % Reduction in Volume: 71.2% Epithelialization: Large (67-100%) Wound Description Classification: Category/Stage III Wound Margin: Distinct, outline attached Exudate Amount: Medium Exudate Type: Serosanguineous Exudate Color: red, brown Foul Odor After Cleansing: No Slough/Fibrino Yes Wound Bed Granulation Amount: Large (67-100%) Exposed Structure Granulation Quality: Red, Pink, Hyper-granulation Fascia Exposed: No Necrotic Amount: None Present (0%) Fat Layer (Subcutaneous Tissue) Exposed: Yes Tendon Exposed: No Muscle Exposed: No Joint Exposed: No Bone Exposed: No TERRIO, TURIN ([id number].pdf Page 6 of 7 Periwound Skin Texture Texture Color No Abnormalities Noted: No No Abnormalities Noted: No Callus: No Atrophie Blanche: No Crepitus: No Cyanosis:  No Excoriation: No Ecchymosis: No Induration: No Erythema: No Rash: No Hemosiderin Staining: No Scarring: No Mottled: No Pallor: No Moisture Rubor: No No Abnormalities Noted: No Dry / Scaly: No Temperature / Pain Maceration: No Temperature: No Abnormality Treatment Notes Wound #2 (Sacrum) Cleanser Soap and Water Discharge Instruction: May shower and wash wound with dial antibacterial soap and water prior to dressing change. Wound Cleanser Discharge Instruction: Cleanse the wound with wound cleanser prior to applying a clean dressing using gauze sponges, not tissue or cotton balls. Peri-Wound Care Skin Prep Discharge Instruction: Use skin prep as directed Topical Keystone Primary Dressing AquacelAg Advantage Dressing, 4x5 (in/in) Secondary Dressing Woven Gauze Sponge, Non-Sterile 4x4 in Discharge Instruction: Apply over primary dressing as directed. Zetuvit Plus Silicone Border Dressing 5x5 (in/in) Discharge Instruction: Apply silicone border over primary dressing as directed. Secured With 4M Medipore H Soft Cloth Surgical T ape, 4 x 10 (in/yd) Discharge Instruction: Secure with tape as directed. Compression Wrap Compression Stockings Add-Ons Electronic Signature(s) Signed: 12/15/2022 4:19:23 PM By: Rhae Hammock RN Entered By: Rhae Hammock on 12/15/2022 11:16:14 -------------------------------------------------------------------------------- Vitals Details Patient Name: Date of Service: Jon Wells [name] 12/15/2022 11:00 A M Medical Record Number: [id number] Patient Account Number: [id number] Date of Birth/Sex: Treating RN: [birth year] (67 y.o. [name] Primary Care Jaselyn Nahm: Serita Grammes Other Clinician: Referring Anhthu Perdew: Treating Charlis Harner/Extender: [name] in Treatment: 24 Vital Signs Time Taken: 11:09 Temperature (F): 98.7 Pulse (bpm): 67 Respiratory Rate (breaths/min): 17 Blood Pressure (mmHg):  157/83 Reference Range: 80 - 120 mg / dl CONNELLY, TRISTAN ([id number].pdf Page 7 of 7 Electronic Signature(s) Signed: 12/15/2022 4:19:23 PM By: Rhae Hammock RN Entered By: Rhae Hammock on 12/15/2022 11:09:30

## 2023-01-12 ENCOUNTER — Encounter (HOSPITAL_BASED_OUTPATIENT_CLINIC_OR_DEPARTMENT_OTHER): Payer: Medicare HMO | Attending: Internal Medicine | Admitting: Internal Medicine

## 2023-01-12 DIAGNOSIS — G8220 Paraplegia, unspecified: Secondary | ICD-10-CM | POA: Diagnosis not present

## 2023-01-12 DIAGNOSIS — L89153 Pressure ulcer of sacral region, stage 3: Secondary | ICD-10-CM | POA: Diagnosis not present

## 2023-01-13 NOTE — Progress Notes (Signed)
MATTEO, LOOKER ([id number]) [id number] Page 1 of 8 Visit Report for 01/12/2023 Chief Complaint Document Details Patient Name: Date of Service: Jon Wells [name] 01/12/2023 11:00 A M Medical Record Number: [id number] Patient Account Number: [id number] Date of Birth/Sex: Treating RN: 1956/07/17 (67 y.o. M) Primary Care Provider: Serita Grammes Other Clinician: Referring Provider: Treating Provider/Extender: [name] in Treatment: 28 Information Obtained from: Patient Chief Complaint 01/09/18; patient is here for a second opinion with regards to a pressure area on the lower sacrum/coccyx 06/30/2022; patient returns to clinic for again a pressure ulcer on the lower sacrum/coccyx Electronic Signature(s) Signed: 01/12/2023 11:59:17 AM By: Kalman Shan DO Entered By: Kalman Shan on 01/12/2023 11:56:51 -------------------------------------------------------------------------------- Debridement Details Patient Name: Date of Service: [name] [name] 01/12/2023 11:00 A M Medical Record Number: [id number] Patient Account Number: [id number] Date of Birth/Sex: Treating RN: [birth year] (67 y.o. Hessie Diener Primary Care Provider: Serita Grammes Other Clinician: Referring Provider: Treating Provider/Extender: [name] in Treatment: 28 Debridement Performed for Assessment: Wound #2 Sacrum Performed By: Physician Kalman Shan, DO Debridement Type: Debridement Level of Consciousness (Pre-procedure): Awake and Alert Pre-procedure Verification/Time Out Yes - 11:20 Taken: Start Time: 11:21 T Area Debrided (L x W): otal 2 (cm) x 1 (cm) = 2 (cm) Tissue and other material debrided: Viable, Slough, Subcutaneous, Skin: Dermis , Skin: Epidermis, Slough Level: Skin/Subcutaneous Tissue Debridement Description: Excisional Instrument: Curette Bleeding: Moderate Hemostasis Achieved: Gel Foam End Time:  11:27 Procedural Pain: 0 Post Procedural Pain: 0 Response to Treatment: Procedure was tolerated well Level of Consciousness (Post- Awake and Alert procedure): Post Debridement Measurements of Total Wound Length: (cm) 2 Stage: Category/Stage III Width: (cm) 1 Depth: (cm) 0.2 Volume: (cm) 0.314 Character of Wound/Ulcer Post Debridement: Improved Post Procedure Diagnosis Same as Pre-procedure Electronic Signature(s) Signed: 01/12/2023 11:59:17 AM By: Kalman Shan DO Signed: 01/12/2023 5:01:14 PM By: Deon Pilling RN, BSN Entered By: Deon Pilling on 01/12/2023 11:27:33 Jon Wells, Jon Wells ([id number]) [id number] Page 2 of 8 -------------------------------------------------------------------------------- HPI Details Patient Name: Date of Service: [name] [name] 01/12/2023 11:00 A M Medical Record Number: [id number] Patient Account Number: [id number] Date of Birth/Sex: Treating RN: 09-May-1956 (67 y.o. 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 He is an active man has a wheelchair with a Roho cushion. Still active around the home. He is been battling an area on the lower sacrum/coccyx for about a year and according to him is been at the [facility] wound care center for 6-7 months. He didn't want [place] to call for records as he was largely here just for a second opinion and therefore I'm not completely certain what is been used to treat this wound. He is currently using calcium alginate. Has used Xeroform and apparently Prisma at least he appeared to recognize Prisma. He doesn't really have much in the way of additional medical issues. He is hypertensive been only medication is  lisinopril. He is not a diabetic. He does digital stimulation for bowel movements and in and out cath for voiding. 01/23/18; the patient arrives having cut a hole out of an old cushion for his wheelchair. This offloaded the area in question. He is also been using Endoform to the actual wounds.  He arrives today with the wounds less than half the size of last time. Really dramatic surprising improvement All capital readmission 06/30/2022 This is a 67 year old man who is now greater than 20 years post traumatic T12 lesion with resultant paralysis. We had them for 2 visits in 2019 for a wound in the same area I do not think he was discharged in a healed state. He tells me that the wound has varied in condition from time to time over the years most of the time and will gradually improve on its own however over the last 3 months or so he has developed the largest wound he has had and it just will not progress towards healing. He has been going to the wound care center in [place] and of course I have none of these notes. They apparently have had trouble getting a dressing to stay on in the area. In his wheelchair he has a Roho cushion with the coccyx area cut out. He tells me he is rigorous about offloading this in bed. Recently they cultured staph there and he has just completed antibiotics although were not sure which ones.He tells me that they could not get any dressing to adhere to the area. He is also concerned about continuous drainage Past medical history hypertension, T12 level paralysis. He has a neurogenic bladder and does in and out caths. His bowel movements via digital stimulation 9/22; patient presents for follow-up. He had a PCR culture done at last clinic visit that grew normal high levels of Enterococcus bacillus and coag negative staph. Keystone antibiotic ointment was ordered. Patient has not received this yet. He has been using silver alginate dressings. He has no issues or complaints  today. 10/6; patient presents for follow-up. He states he received Keystone antibiotic ointment and has been using this with silver alginate. He has no issues or complaints today. 11/30; patient was last seen in the beginning of October. He has missed his last clinic appointment. He states he has been using silver alginate to the wound bed. He has not been using Keystone antibiotic ointment. He currently denies signs of infection. It is unclear if he can offload this area very well. 12/21; patient presents for follow-up. He has been using Keystone antibiotic ointment with silver alginate. He has no issues or complaints today. 2/1; patient presents for follow-up. He has been using Keystone antibiotic ointment with Aquacel Ag. He states he ran out of the antibiotic ointment. On that he has no issues or complaints today. 2/29; patient presents for follow-up. Has been using Keystone antibiotic ointment with Aquacel Ag. He has no issues or complaints today. 3/28; patient presents for follow-up. He has been using Aquacel Ag. He does not want to use Keystone antibiotic anymore. He has no issues or complaints today. The wound is smaller. Electronic Signature(s) Signed: 01/12/2023 11:59:17 AM By: Kalman Shan DO Entered By: Kalman Shan on 01/12/2023 11:57:14 -------------------------------------------------------------------------------- Physical Exam Details Patient Name: Date of Service: [name] [name] 01/12/2023 11:00 A M Medical Record Number: [id number] Patient Account Number: [id number] Date of Birth/Sex: Treating RN: Nov 26, 1955 (67 y.o. M) Primary Care Provider: Serita Grammes Other Clinician: Referring Provider: Treating Provider/Extender: [name] in Treatment: 28 Constitutional respirations regular, non-labored and within target range for patient.. Cardiovascular 2+ dorsalis pedis/posterior tibialis pulses. Psychiatric pleasant and  cooperative. CORNELL, LUTMAN ([id number]) [id number] Page 3 of 8 Notes T the lower part of the coccyx there is an open wound with rolled senescent edges  with granulation tissue at the base and slough. Devitalized tissue to the o edges circumferentially. No signs of surrounding infection. Electronic Signature(s) Signed: 01/12/2023 11:59:17 AM By: Kalman Shan DO Entered By: Kalman Shan on 01/12/2023 11:57:49 -------------------------------------------------------------------------------- Physician Orders Details Patient Name: Date of Service: [name] [name] 01/12/2023 11:00 A M Medical Record Number: [id number] Patient Account Number: [id number] Date of Birth/Sex: Treating RN: 1956-06-10 (67 y.o. [name] Primary Care Provider: Serita Grammes Other Clinician: Referring Provider: Treating Provider/Extender: [name] in Treatment: 13 Verbal / Phone Orders: No Diagnosis Coding Follow-up Appointments ppointment in: - May 9 Thursday room 8 1100 02/23/2023 Return A Off-Loading Roho cushion for wheelchair - continue to use. Turn and reposition every 2 hours Wound Treatment Wound #2 - Sacrum Cleanser: Soap and Water 1 x Per Day/30 Days Discharge Instructions: May shower and wash wound with dial antibacterial soap and water prior to dressing change. Cleanser: Wound Cleanser (Generic) 1 x Per Day/30 Days Discharge Instructions: Cleanse the wound with wound cleanser prior to applying a clean dressing using gauze sponges, not tissue or cotton balls. Peri-Wound Care: Skin Prep (Generic) 1 x Per Day/30 Days Discharge Instructions: Use skin prep as directed Prim Dressing: AquacelAg Advantage Dressing, 4x5 (in/in) (Generic) 1 x Per Day/30 Days ary Secondary Dressing: Woven Gauze Sponge, Non-Sterile 4x4 in (Generic) 1 x Per Day/30 Days Discharge Instructions: Apply over primary dressing as directed. Secondary  Dressing: Zetuvit Plus Silicone Border Dressing 5x5 (in/in) (Generic) 1 x Per Day/30 Days Discharge Instructions: Apply silicone border over primary dressing as directed. Secured With: 35M Medipore H Soft Cloth Surgical T ape, 4 x 10 (in/yd) (Generic) 1 x Per Day/30 Days Discharge Instructions: Secure with tape as directed. Electronic Signature(s) Signed: 01/12/2023 11:59:17 AM By: Kalman Shan DO Entered By: Kalman Shan on 01/12/2023 11:57:57 -------------------------------------------------------------------------------- Problem List Details Patient Name: Date of Service: [name] [name] 01/12/2023 11:00 A M Medical Record Number: [id number] Patient Account Number: [id number] Date of Birth/Sex: Treating RN: Jan 07, 1956 (67 y.o. Hessie Diener Primary Care Provider: Serita Grammes Other Clinician: Referring Provider: Treating Provider/Extender: [name] in Treatment: 24 Active Problems ICD-10 Encounter Code Description Active Date MDM NETANEL, HANELINE ([id number]) [phone].pdf Page 4 of 8 Code Description Active Date MDM Diagnosis L89.153 Pressure ulcer of sacral region, stage 3 06/30/2022 No Yes S24.114D Complete lesion at T11-T12 level of thoracic spinal cord, subsequent encounter 06/30/2022 No Yes Inactive Problems Resolved Problems Electronic Signature(s) Signed: 01/12/2023 11:59:17 AM By: Kalman Shan DO Entered By: Kalman Shan on 01/12/2023 11:56:28 -------------------------------------------------------------------------------- Progress Note Details Patient Name: Date of Service: [name] [name] 01/12/2023 11:00 A M Medical Record Number: [id number] Patient Account Number: [id number] Date of Birth/Sex: Treating RN: [birth year] (67 y.o. 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 He is an active man has a wheelchair with a Roho cushion. Still active around the home. He is been battling an area on the lower sacrum/coccyx for about a year and according to  him is been at the [facility] wound care center for 6-7 months. He didn't want [place] to call for records as he was largely here just for a second opinion and therefore I'm not completely certain what is been used to treat this wound. He is currently using calcium alginate. Has used Xeroform and apparently Prisma at least he appeared to recognize Prisma. He doesn't really have much in the way of additional medical issues. He is hypertensive been only medication is lisinopril. He is not a diabetic. He does digital stimulation for bowel movements and in and out cath for voiding. 01/23/18; the patient arrives having cut a hole out of an old cushion for his wheelchair. This offloaded the area in question. He is also been using Endoform to the actual wounds. He arrives today with the wounds less than half the size of last time. Really dramatic surprising improvement All capital readmission 06/30/2022 This is a 67 year old man who is now greater than 20 years post traumatic T12 lesion with resultant paralysis. We had them for 2 visits in 2019 for a wound in the same area I do not think he was discharged in a healed state. He tells me that the wound has varied in condition from time to time over the years most of the time and will gradually improve on its own however over the last 3 months or so he has developed the largest  wound he has had and it just will not progress towards healing. He has been going to the wound care center in [place] and of course I have none of these notes. They apparently have had trouble getting a dressing to stay on in the area. In his wheelchair he has a Roho cushion with the coccyx area cut out. He tells me he is rigorous about offloading this in bed. Recently they cultured staph there and he has just completed antibiotics although were not sure which ones.He tells me that they could not get any dressing to adhere to the area. He is also concerned about continuous drainage Past medical history hypertension, T12 level paralysis. He has a neurogenic bladder and does in and out caths. His bowel movements via digital stimulation 9/22; patient presents for follow-up. He had a PCR culture done at last clinic visit that grew normal high levels of Enterococcus bacillus and coag negative staph. Keystone antibiotic ointment was ordered. Patient has not received this yet. He has been using silver alginate dressings. He has no issues or complaints today. 10/6; patient presents for follow-up. He states he received Keystone antibiotic ointment and has been using this with silver alginate. He has no issues or complaints today. 11/30; patient was last seen in the beginning of October. He has missed his last clinic appointment. He states he has been using silver alginate to the wound bed. He has not been using Keystone antibiotic ointment. He currently denies signs of infection. It is unclear if he can offload this area very well. 12/21; patient presents for follow-up. He has been using Keystone antibiotic ointment with silver alginate. He has no issues or complaints today. 2/1; patient presents for follow-up. He has been using Keystone antibiotic ointment with Aquacel Ag. He states he ran out of the antibiotic ointment. On that he has no issues or complaints today. 2/29; patient presents for follow-up. Has  been using Keystone antibiotic ointment with Aquacel Ag. He has no issues or complaints today. 3/28; patient presents for follow-up. He has been using Aquacel Ag. He does not  want to use Keystone antibiotic anymore. He has no issues or complaints today. The wound is smaller. Patient History Jon Wells, Jon Wells ([id number]) [id number] Page 5 of 8 Information obtained from Patient. Family History Diabetes - Mother, Hypertension - Mother,Father, Kidney Disease - Mother, No family history of Cancer, Heart Disease, Hereditary Spherocytosis, Lung Disease, Seizures, Stroke, Thyroid Problems, Tuberculosis. Social History Never smoker, Marital Status - Married, Alcohol Use - Rarely, Drug Use - No History, Caffeine Use - Daily - coffee. Medical History Eyes Denies history of Cataracts, Glaucoma, Optic Neuritis Ear/Nose/Mouth/Throat Denies history of Chronic sinus problems/congestion, Middle ear problems Hematologic/Lymphatic Denies history of Anemia, Hemophilia, Human Immunodeficiency Virus, Lymphedema, Sickle Cell Disease Respiratory Denies history of Aspiration, Asthma, Chronic Obstructive Pulmonary Disease (COPD), Pneumothorax, Sleep Apnea, Tuberculosis Cardiovascular Patient has history of Hypertension Denies history of Angina, Arrhythmia, Congestive Heart Failure, Coronary Artery Disease, Deep Vein Thrombosis, Hypotension, Myocardial Infarction, Peripheral Arterial Disease, Peripheral Venous Disease, Phlebitis, Vasculitis Gastrointestinal Denies history of Cirrhosis , Colitis, Crohnoos, Hepatitis A, Hepatitis B, Hepatitis C Endocrine Denies history of Type I Diabetes, Type II Diabetes Genitourinary Denies history of End Stage Renal Disease Immunological Denies history of Lupus Erythematosus, Raynaudoos, Scleroderma Integumentary (Skin) Denies history of History of Burn Musculoskeletal Denies history of Gout, Rheumatoid Arthritis, Osteoarthritis,  Osteomyelitis Neurologic Patient has history of Paraplegia - T12 Denies history of Dementia, Neuropathy, Quadriplegia, Seizure Disorder Oncologic Denies history of Received Chemotherapy, Received Radiation Psychiatric Denies history of Anorexia/bulimia, Confinement Anxiety Hospitalization/Surgery History - back surgery. Medical A Surgical History Notes nd Genitourinary self cath Objective Constitutional respirations regular, non-labored and within target range for patient.. Vitals Time Taken: 11:13 AM, Temperature: 98.3 F, Pulse: 66 bpm, Respiratory Rate: 18 breaths/min, Blood Pressure: 149/92 mmHg. Cardiovascular 2+ dorsalis pedis/posterior tibialis pulses. Psychiatric pleasant and cooperative. General Notes: T the lower part of the coccyx there is an open wound with rolled senescent edges with granulation tissue at the base and slough. Devitalized o tissue to the edges circumferentially. No signs of surrounding infection. Integumentary (Hair, Skin) Wound #2 status is Open. Original cause of wound was Pressure Injury. The date acquired was: 03/17/2022. The wound has been in treatment 28 weeks. The wound is located on the Sacrum. The wound measures 2cm length x 1cm width x 0.2cm depth; 1.571cm^2 area and 0.314cm^3 volume. There is Fat Layer (Subcutaneous Tissue) exposed. There is no tunneling or undermining noted. There is a medium amount of serosanguineous drainage noted. The wound margin is distinct with the outline attached to the wound base. There is large (67-100%) red, pink, hyper - granulation within the wound bed. There is no necrotic tissue within the wound bed. The periwound skin appearance did not exhibit: Callus, Crepitus, Excoriation, Induration, Rash, Scarring, Dry/Scaly, Maceration, Atrophie Blanche, Cyanosis, Ecchymosis, Hemosiderin Staining, Mottled, Pallor, Rubor, Erythema. Periwound temperature was noted as No Abnormality. Assessment Jon Wells, Jon Wells ([id number])  [id number] Page 6 of 8 Active Problems ICD-10 Pressure ulcer of sacral region, stage 3 Complete lesion at T11-T12 level of thoracic spinal cord, subsequent encounter Patient's wound has shown improvement in size in appearance since last clinic visit. I debrided nonviable tissue. I recommended continuing the course with Aquacel Ag. Follow-up in 6 weeks. He knows to call with any questions or concerns. Procedures Wound #2 Pre-procedure diagnosis of Wound #2 is a Pressure Ulcer located on the Sacrum . There was a Excisional Skin/Subcutaneous Tissue Debridement with a total area of 2 sq cm performed by Kalman Shan, DO. With the following instrument(s): Curette to remove Viable tissue/material.  Material removed includes Subcutaneous Tissue, Slough, Skin: Dermis, and Skin: Epidermis. A time out was conducted at 11:20, prior to the start of the procedure. A Moderate amount of bleeding was controlled with Gel Foam. The procedure was tolerated well with a pain level of 0 throughout and a pain level of 0 following the procedure. Post Debridement Measurements: 2cm length x 1cm width x 0.2cm depth; 0.314cm^3 volume. Post debridement Stage noted as Category/Stage III. Character of Wound/Ulcer Post Debridement is improved. Post procedure Diagnosis Wound #2: Same as Pre-Procedure Plan Follow-up Appointments: Return Appointment in: - May 9 Thursday room 8 1100 02/23/2023 Off-Loading: Roho cushion for wheelchair - continue to use. Turn and reposition every 2 hours WOUND #2: - Sacrum Wound Laterality: Cleanser: Soap and Water 1 x Per Day/30 Days Discharge Instructions: May shower and wash wound with dial antibacterial soap and water prior to dressing change. Cleanser: Wound Cleanser (Generic) 1 x Per Day/30 Days Discharge Instructions: Cleanse the wound with wound cleanser prior to applying a clean dressing using gauze sponges, not tissue or cotton balls. Peri-Wound Care:  Skin Prep (Generic) 1 x Per Day/30 Days Discharge Instructions: Use skin prep as directed Prim Dressing: AquacelAg Advantage Dressing, 4x5 (in/in) (Generic) 1 x Per Day/30 Days ary Secondary Dressing: Woven Gauze Sponge, Non-Sterile 4x4 in (Generic) 1 x Per Day/30 Days Discharge Instructions: Apply over primary dressing as directed. Secondary Dressing: Zetuvit Plus Silicone Border Dressing 5x5 (in/in) (Generic) 1 x Per Day/30 Days Discharge Instructions: Apply silicone border over primary dressing as directed. Secured With: 81M Medipore H Soft Cloth Surgical T ape, 4 x 10 (in/yd) (Generic) 1 x Per Day/30 Days Discharge Instructions: Secure with tape as directed. 1. In office sharp debridement 2. Aquacel Ag 3. Aggressive offloading 4. Follow-up in 6 weeks Electronic Signature(s) Signed: 01/12/2023 11:59:17 AM By: Kalman Shan DO Entered By: Kalman Shan on 01/12/2023 11:58:46 -------------------------------------------------------------------------------- HxROS Details Patient Name: Date of Service: [name] [name] 01/12/2023 11:00 A M Medical Record Number: [id number] Patient Account Number: [id number] Date of Birth/Sex: Treating RN: 07-Aug-1956 (67 y.o. M) Primary Care Provider: Serita Grammes Other Clinician: Referring Provider: Treating Provider/Extender: [name] in Treatment: 28 Information Obtained From Patient Eyes Medical History: Negative for: Cataracts; Glaucoma; Optic Neuritis Jon Wells, Jon Wells ([id number]) [phone].pdf Page 7 of 8 Ear/Nose/Mouth/Throat Medical History: Negative for: Chronic sinus problems/congestion; Middle ear problems Hematologic/Lymphatic Medical History: Negative for: Anemia; Hemophilia; Human Immunodeficiency Virus; Lymphedema; Sickle Cell Disease Respiratory Medical History: Negative for: Aspiration; Asthma; Chronic Obstructive Pulmonary Disease (COPD); Pneumothorax; Sleep Apnea;  Tuberculosis Cardiovascular Medical History: Positive for: Hypertension Negative for: Angina; Arrhythmia; Congestive Heart Failure; Coronary Artery Disease; Deep Vein Thrombosis; Hypotension; Myocardial Infarction; Peripheral Arterial Disease; Peripheral Venous Disease; Phlebitis; Vasculitis Gastrointestinal Medical History: Negative for: Cirrhosis ; Colitis; Crohns; Hepatitis A; Hepatitis B; Hepatitis C Endocrine Medical History: Negative for: Type I Diabetes; Type II Diabetes Genitourinary Medical History: Negative for: End Stage Renal Disease Past Medical History Notes: self cath Immunological Medical History: Negative for: Lupus Erythematosus; Raynauds; Scleroderma Integumentary (Skin) Medical History: Negative for: History of Burn Musculoskeletal Medical History: Negative for: Gout; Rheumatoid Arthritis; Osteoarthritis; Osteomyelitis Neurologic Medical History: Positive for: Paraplegia - T12 Negative for: Dementia; Neuropathy; Quadriplegia; Seizure Disorder Oncologic Medical History: Negative for: Received Chemotherapy; Received Radiation Psychiatric Medical History: Negative for: Anorexia/bulimia; Confinement Anxiety Immunizations Pneumococcal Vaccine: Received Pneumococcal Vaccination: No Implantable Devices No devices added Jon Wells, Jon Wells ([id number]) [phone].pdf Page 8 of 8 Hospitalization / Surgery History Type of Hospitalization/Surgery back surgery  Family and Social History Cancer: No; Diabetes: Yes - Mother; Heart Disease: No; Hereditary Spherocytosis: No; Hypertension: Yes - Mother,Father; Kidney Disease: Yes - Mother; Lung Disease: No; Seizures: No; Stroke: No; Thyroid Problems: No; Tuberculosis: No; Never smoker; Marital Status - Married; Alcohol Use: Rarely; Drug Use: No History; Caffeine Use: Daily - coffee; Financial Concerns: No; Food, Clothing or Shelter Needs: No; Support System Lacking: No; Transportation Concerns:  No Electronic Signature(s) Signed: 01/12/2023 11:59:17 AM By: Kalman Shan DO Entered By: Kalman Shan on 01/12/2023 11:57:22 -------------------------------------------------------------------------------- SuperBill Details Patient Name: Date of Service: [name] [name] 01/12/2023 Medical Record Number: [id number] Patient Account Number: [id number] Date of Birth/Sex: Treating RN: 1956/02/07 (67 y.o. Hessie Diener Primary Care Provider: Serita Grammes Other Clinician: Referring Provider: Treating Provider/Extender: [name] in Treatment: 28 Diagnosis Coding ICD-10 Codes Code Description [phone] Pressure ulcer of sacral region, stage 3 S24.114D Complete lesion at T11-T12 level of thoracic spinal cord, subsequent encounter Facility Procedures : CPT4 Code: [id number] Description: [place] SUBQ TISSUE 20 SQ CM/< ICD-10 Diagnosis Description L89.153 Pressure ulcer of sacral region, stage 3 Modifier: Quantity: 1 Physician Procedures : CPT4 Code Description Modifier [id number] - WC PHYS SUBQ TISS 20 SQ CM ICD-10 Diagnosis Description L89.153 Pressure ulcer of sacral region, stage 3 Quantity: 1 Electronic Signature(s) Signed: 01/12/2023 11:59:17 AM By: Kalman Shan DO Entered By: Kalman Shan on 01/12/2023 11:58:55

## 2023-01-18 NOTE — Progress Notes (Addendum)
Jon Wells, Jon Wells ([id number]) [id number] Page 1 of 6 Visit Report for 01/12/2023 Arrival Information Details Patient Name: Date of Service: Jon Aline [name] 01/12/2023 11:00 A M Medical Record Number: [id number] Patient Account Number: [id number] Date of Birth/Sex: Treating RN: Feb 05, 1956 (67 y.o. M) Primary Care Jon Wells: Jon Wells Other Clinician: Referring Jon Wells: Treating Jon Wells/Extender: Jon Wells in Treatment: 28 Visit Information History Since Last Visit Added or deleted any medications: No Patient Arrived: [place] Any new allergies or adverse reactions: No Arrival Time: 11:13 Had a fall or experienced change in No Accompanied By: self activities of daily living that may affect Transfer Assistance: None risk of falls: Patient Identification Verified: Yes Signs or symptoms of abuse/neglect since last visito No Secondary Verification Process Completed: Yes Hospitalized since last visit: No Patient Requires Transmission-Based Precautions: No Implantable device outside of the clinic excluding No Patient Has Alerts: No cellular tissue based products placed in the center since last visit: Has Dressing in Place as Prescribed: Yes Pain Present Now: No Electronic Signature(s) Signed: 01/12/2023 4:59:34 PM By: Thayer Dallas Entered By: Thayer Dallas on 01/12/2023 11:13:27 -------------------------------------------------------------------------------- Encounter Discharge Information Details Patient Name: Date of Service: Jon Montel Clock [name] 01/12/2023 11:00 A M Medical Record Number: [id number] Patient Account Number: [id number] Date of Birth/Sex: Treating RN: [birth year] (67 y.o. Jon Wells Primary Care Roneshia Drew: Jon Wells Other Clinician: Referring Kadan Millstein: Treating Sevyn Paredez/Extender: Jon Wells in Treatment: 28 Encounter Discharge Information Items Post Procedure  Vitals Discharge Condition: Stable Temperature (F): 98.3 Ambulatory Status: Wheelchair Pulse (bpm): 66 Discharge Destination: Home Respiratory Rate (breaths/min): 18 Transportation: Private Auto Blood Pressure (mmHg): 149/92 Accompanied By: self Schedule Follow-up Appointment: Yes Clinical Summary of Care: Electronic Signature(s) Signed: 01/12/2023 5:01:14 PM By: Jon Stall RN, BSN Entered By: Jon Wells on 01/12/2023 11:28:37 Jon Wells ([id number]) [id number] Page 2 of 6 -------------------------------------------------------------------------------- Lower Extremity Assessment Details Patient Name: Date of Service: Jon Aline [name] 01/12/2023 11:00 A M Medical Record Number: [id number] Patient Account Number: [id number] Date of Birth/Sex: Treating RN: [birth year] (67 y.o. M) Primary Care Latrice Storlie: Jon Wells Other Clinician: Referring Karalynn Cottone: Treating Laurelle Skiver/Extender: Jon Wells in Treatment: 28 Electronic Signature(s) Signed: 01/12/2023 4:59:34 PM By: Thayer Dallas Entered By: Thayer Dallas on 01/12/2023 11:14:06 -------------------------------------------------------------------------------- Multi Wound Chart Details Patient Name: Date of Service: Jon Aline [name] 01/12/2023 11:00 A M Medical Record Number: [id number] Patient Account Number: [id number] Date of Birth/Sex: Treating RN: 1955-11-15 (67 y.o. M) Primary Care Raylon Lamson: Jon Wells Other Clinician: Referring Sharnette Kitamura: Treating Austine Wiedeman/Extender: Jon Wells in Treatment: 28 Vital Signs Height(in): Pulse(bpm): 66 Weight(lbs): Blood Pressure(mmHg): 149/92 Body Mass Index(BMI): Temperature(F): 98.3 Respiratory Rate(breaths/min): 18 [2:Photos:] [N/A:N/A] Sacrum N/A N/A Wound Location: Pressure Injury N/A N/A Wounding Event: Pressure Ulcer N/A N/A Primary Etiology: Hypertension, Paraplegia N/A  N/A Comorbid History: 03/17/2022 N/A N/A Date Acquired: 45 N/A N/A Weeks of Treatment: Open N/A N/A Wound Status: No N/A N/A Wound Recurrence: 2x1x0.2 N/A N/A Measurements L x W x D (cm) 1.571 N/A N/A A (cm) : rea 0.314 N/A N/A Volume (cm) : 85.50% N/A N/A % Reduction in A rea: 90.30% N/A N/A % Reduction in Volume: Category/Stage III N/A N/A Classification: Medium N/A N/A Exudate A mount: Serosanguineous N/A N/A Exudate Type: red, brown N/A N/A Exudate Color: Distinct, outline attached N/A N/A Wound Margin: Large (67-100%) N/A N/A Granulation A mount: Red, Pink, Hyper-granulation N/A N/A Granulation Quality: None Present (0%) N/A N/A Necrotic A  mount: Fat Layer (Subcutaneous Tissue): Yes N/A N/A Exposed Structures: Jon Wells ([id number]) [id number] Page 3 of 6 Fascia: No Tendon: No Muscle: No Joint: No Bone: No Large (67-100%) N/A N/A Epithelialization: Debridement - Excisional N/A N/A Debridement: Pre-procedure Verification/Time Out 11:20 N/A N/A Taken: Subcutaneous, Slough N/A N/A Tissue Debrided: Skin/Subcutaneous Tissue N/A N/A Level: 2 N/A N/A Debridement A (sq cm): rea Curette N/A N/A Instrument: Moderate N/A N/A Bleeding: Gel Foam N/A N/A Hemostasis A chieved: 0 N/A N/A Procedural Pain: 0 N/A N/A Post Procedural Pain: Procedure was tolerated well N/A N/A Debridement Treatment Response: 2x1x0.2 N/A N/A Post Debridement Measurements L x W x D (cm) 0.314 N/A N/A Post Debridement Volume: (cm) Category/Stage III N/A N/A Post Debridement Stage: Excoriation: No N/A N/A Periwound Skin Texture: Induration: No Callus: No Crepitus: No Rash: No Scarring: No Maceration: No N/A N/A Periwound Skin Moisture: Dry/Scaly: No Atrophie Blanche: No N/A N/A Periwound Skin Color: Cyanosis: No Ecchymosis: No Erythema: No Hemosiderin Staining: No Mottled: No Pallor: No Rubor: No No Abnormality N/A  N/A Temperature: Debridement N/A N/A Procedures Performed: Treatment Notes Wound #2 (Sacrum) Cleanser Soap and Water Discharge Instruction: May shower and wash wound with dial antibacterial soap and water prior to dressing change. Wound Cleanser Discharge Instruction: Cleanse the wound with wound cleanser prior to applying a clean dressing using gauze sponges, not tissue or cotton balls. Peri-Wound Care Skin Prep Discharge Instruction: Use skin prep as directed Topical Primary Dressing AquacelAg Advantage Dressing, 4x5 (in/in) Secondary Dressing Woven Gauze Sponge, Non-Sterile 4x4 in Discharge Instruction: Apply over primary dressing as directed. Zetuvit Plus Silicone Border Dressing 5x5 (in/in) Discharge Instruction: Apply silicone border over primary dressing as directed. Secured With 26M Medipore H Soft Cloth Surgical T ape, 4 x 10 (in/yd) Discharge Instruction: Secure with tape as directed. Compression Wrap Compression Stockings Add-Ons Electronic Signature(s) Signed: 01/12/2023 11:59:17 AM By: Geralyn Corwin DO Entered By: Geralyn Corwin on 01/12/2023 11:56:37 ANICETO, DENNING ([id number]) [phone].pdf Page 4 of 6 -------------------------------------------------------------------------------- Multi-Disciplinary Care Plan Details Patient Name: Date of Service: Jon Aline [name] 01/12/2023 11:00 A M Medical Record Number: [id number] Patient Account Number: [id number] Date of Birth/Sex: Treating RN: October 21, 1955 (67 y.o. Charlean Merl, [name] Primary Care Lamona Eimer: Jon Wells Other Clinician: Referring Tamir Wallman: Treating Gennifer Potenza/Extender: Jon Wells in Treatment: 28 Active Inactive Electronic Signature(s) Signed: 02/28/2023 1:21:46 PM By: Jon Stall RN, BSN Signed: 06/29/2023 9:38:45 AM By: Fonnie Mu RN Previous Signature: 01/18/2023 4:24:30 PM Version By: Fonnie Mu RN Entered By: Jon Wells on  02/28/2023 13:21:46 -------------------------------------------------------------------------------- Pain Assessment Details Patient Name: Date of Service: Jon Aline [name] 01/12/2023 11:00 A M Medical Record Number: [id number] Patient Account Number: [id number] Date of Birth/Sex: Treating RN: 10/05/1956 (67 y.o. M) Primary Care Keni Elison: Jon Wells Other Clinician: Referring Torrence Hammack: Treating Netta Fodge/Extender: Jon Wells in Treatment: 28 Active Problems Location of Pain Severity and Description of Pain Patient Has Paino No Site Locations Pain Management and Medication Current Pain Management: Electronic Signature(s) Signed: 01/12/2023 4:59:34 PM By: Thayer Dallas Entered By: Thayer Dallas on 01/12/2023 11:13:52 ALA, DESAUTEL ([id number]) [phone].pdf Page 5 of 6 -------------------------------------------------------------------------------- Patient/Caregiver Education Details Patient Name: Date of Service: Jon Montel Clock [name] 3/28/2024andnbsp11:00 A M Medical Record Number: [id number] Patient Account Number: [id number] Date of Birth/Gender: Treating RN: 11/19/1955 (67 y.o. Lucious Groves Primary Care Physician: Jon Wells Other Clinician: Referring Physician: Treating Physician/Extender: Jon Wells in Treatment: 28 Education Assessment Education Provided To: Patient  Education Topics Provided Wound/Skin Impairment: Methods: Explain/Verbal Responses: Reinforcements needed, State content correctly Electronic Signature(s) Signed: 01/18/2023 4:24:30 PM By: Fonnie Mu RN Entered By: Fonnie Mu on 01/12/2023 11:21:29 -------------------------------------------------------------------------------- Wound Assessment Details Patient Name: Date of Service: Jon Montel Clock [name] 01/12/2023 11:00 A M Medical Record Number: [id number] Patient Account Number: [id number] Date of  Birth/Sex: Treating RN: 1956/06/03 (67 y.o. M) Primary Care Gelisa Tieken: Jon Wells Other Clinician: Referring Atilano Covelli: Treating Lashuna Tamashiro/Extender: Jon Wells in Treatment: 28 Wound Status Wound Number: 2 Primary Etiology: Pressure Ulcer Wound Location: Sacrum Wound Status: Open Wounding Event: Pressure Injury Comorbid History: Hypertension, Paraplegia Date Acquired: 03/17/2022 Weeks Of Treatment: 28 Clustered Wound: No Photos Wound Measurements ADISA, ORILEY ([id number]) Length: (cm) 2 Width: (cm) 1 Depth: (cm) 0. Area: (cm) 1 Volume: (cm) 0 [id number] Page 6 of 6 % Reduction in Area: 85.5% % Reduction in Volume: 90.3% 2 Epithelialization: Large (67-100%) .571 Tunneling: No .314 Undermining: No Wound Description Classification: Category/Stage III Wound Margin: Distinct, outline attached Exudate Amount: Medium Exudate Type: Serosanguineous Exudate Color: red, brown Foul Odor After Cleansing: No Slough/Fibrino Yes Wound Bed Granulation Amount: Large (67-100%) Exposed Structure Granulation Quality: Red, Pink, Hyper-granulation Fascia Exposed: No Necrotic Amount: None Present (0%) Fat Layer (Subcutaneous Tissue) Exposed: Yes Tendon Exposed: No Muscle Exposed: No Joint Exposed: No Bone Exposed: No Periwound Skin Texture Texture Color No Abnormalities Noted: No No Abnormalities Noted: No Callus: No Atrophie Blanche: No Crepitus: No Cyanosis: No Excoriation: No Ecchymosis: No Induration: No Erythema: No Rash: No Hemosiderin Staining: No Scarring: No Mottled: No Pallor: No Moisture Rubor: No No Abnormalities Noted: No Dry / Scaly: No Temperature / Pain Maceration: No Temperature: No Abnormality Electronic Signature(s) Signed: 01/12/2023 4:59:34 PM By: Thayer Dallas Entered By: Thayer Dallas on 01/12/2023  11:17:46 -------------------------------------------------------------------------------- Vitals Details Patient Name: Date of Service: Jon Montel Clock [name] 01/12/2023 11:00 A M Medical Record Number: [id number] Patient Account Number: [id number] Date of Birth/Sex: Treating RN: 1955/10/25 (67 y.o. M) Primary Care Oneda Duffett: Jon Wells Other Clinician: Referring Carmeron Heady: Treating Tamisha Nordstrom/Extender: Jon Wells in Treatment: 28 Vital Signs Time Taken: 11:13 Temperature (F): 98.3 Pulse (bpm): 66 Respiratory Rate (breaths/min): 18 Blood Pressure (mmHg): 149/92 Reference Range: 80 - 120 mg / dl Electronic Signature(s) Signed: 01/12/2023 4:59:34 PM By: Thayer Dallas Entered By: Thayer Dallas on 01/12/2023 11:13:47

## 2023-02-23 ENCOUNTER — Encounter (HOSPITAL_BASED_OUTPATIENT_CLINIC_OR_DEPARTMENT_OTHER): Payer: 59 | Admitting: Internal Medicine

## 2024-05-22 ENCOUNTER — Other Ambulatory Visit (HOSPITAL_COMMUNITY): Payer: Self-pay | Admitting: Orthopedic Surgery

## 2024-05-22 DIAGNOSIS — M25511 Pain in right shoulder: Secondary | ICD-10-CM

## 2024-05-24 ENCOUNTER — Encounter (HOSPITAL_COMMUNITY): Payer: Self-pay

## 2024-05-24 ENCOUNTER — Ambulatory Visit (HOSPITAL_COMMUNITY)
Admission: RE | Admit: 2024-05-24 | Discharge: 2024-05-24 | Disposition: A | Discharge status: Home / Self Care | Source: Ambulatory Visit | Attending: Orthopedic Surgery | Admitting: Orthopedic Surgery

## 2024-05-24 DIAGNOSIS — M25511 Pain in right shoulder: Secondary | ICD-10-CM

## 2024-05-27 ENCOUNTER — Other Ambulatory Visit (HOSPITAL_COMMUNITY): Payer: Self-pay | Admitting: Orthopedic Surgery

## 2024-05-27 DIAGNOSIS — M25512 Pain in left shoulder: Secondary | ICD-10-CM

## 2024-05-29 ENCOUNTER — Ambulatory Visit (HOSPITAL_COMMUNITY)
Admission: RE | Admit: 2024-05-29 | Discharge: 2024-05-29 | Disposition: A | Discharge status: Home / Self Care | Source: Ambulatory Visit | Attending: Orthopedic Surgery | Admitting: Orthopedic Surgery

## 2024-05-29 DIAGNOSIS — M25512 Pain in left shoulder: Secondary | ICD-10-CM | POA: Insufficient documentation

## 2024-09-04 NOTE — Progress Notes (Signed)
 For Anesthesia: PCP - Delon Contes, MD  Cardiologist - N/A  Bowel Prep reminder:N/A  Chest x-ray - N/A EKG - N/A Stress Test - N/A ECHO - N/A Cardiac Cath - N/A Pacemaker/ICD device last checked:N/A Pacemaker orders received: N/A Device Rep notified: N/A  Spinal Cord Stimulator: N/A  Sleep Study - N/A CPAP - N/A   Fasting Blood Sugar - N/A Checks Blood Sugar __N/A___ times a day Date and result of last Hgb A1c-N/A  Last dose of GLP1 agonist- N/A GLP1 instructions: Hold 7 days prior to schedule (Hold 24 hours-daily)   Last dose of SGLT-2 inhibitors- N/A SGLT-2 instructions: Hold 72 hours prior to surgery  Blood Thinner Instructions: N/A Last Dose: N/A Time last taken:N/A  Aspirin Instructions:N/A Last Dose:N/A Time last taken:N/A  Activity level: paralegic     Anesthesia review: N/A  Patient denies shortness of breath, fever, cough and chest pain at PAT appointment   Patient verbalized understanding of instructions that were reviewed over the telephone.

## 2024-09-05 ENCOUNTER — Encounter (HOSPITAL_COMMUNITY): Payer: Self-pay | Admitting: Orthopedic Surgery

## 2024-09-05 ENCOUNTER — Other Ambulatory Visit: Payer: Self-pay

## 2024-09-06 ENCOUNTER — Ambulatory Visit (HOSPITAL_COMMUNITY)
Admission: RE | Admit: 2024-09-06 | Discharge: 2024-09-06 | Disposition: A | Discharge status: Home / Self Care | Attending: Orthopedic Surgery | Admitting: Orthopedic Surgery

## 2024-09-06 ENCOUNTER — Encounter (HOSPITAL_COMMUNITY): Admission: RE | Disposition: A | Payer: Self-pay | Source: Home / Self Care | Attending: Orthopedic Surgery

## 2024-09-06 ENCOUNTER — Ambulatory Visit (HOSPITAL_COMMUNITY): Admitting: Anesthesiology

## 2024-09-06 ENCOUNTER — Encounter (HOSPITAL_COMMUNITY): Payer: Self-pay | Admitting: Orthopedic Surgery

## 2024-09-06 DIAGNOSIS — M75102 Unspecified rotator cuff tear or rupture of left shoulder, not specified as traumatic: Secondary | ICD-10-CM | POA: Insufficient documentation

## 2024-09-06 DIAGNOSIS — I10 Essential (primary) hypertension: Secondary | ICD-10-CM

## 2024-09-06 DIAGNOSIS — M19011 Primary osteoarthritis, right shoulder: Secondary | ICD-10-CM | POA: Diagnosis not present

## 2024-09-06 DIAGNOSIS — Z87891 Personal history of nicotine dependence: Secondary | ICD-10-CM | POA: Diagnosis not present

## 2024-09-06 DIAGNOSIS — M7542 Impingement syndrome of left shoulder: Secondary | ICD-10-CM

## 2024-09-06 DIAGNOSIS — Z79899 Other long term (current) drug therapy: Secondary | ICD-10-CM | POA: Insufficient documentation

## 2024-09-06 DIAGNOSIS — S46112A Strain of muscle, fascia and tendon of long head of biceps, left arm, initial encounter: Secondary | ICD-10-CM | POA: Diagnosis not present

## 2024-09-06 DIAGNOSIS — G8220 Paraplegia, unspecified: Secondary | ICD-10-CM | POA: Insufficient documentation

## 2024-09-06 DIAGNOSIS — M7552 Bursitis of left shoulder: Secondary | ICD-10-CM | POA: Diagnosis not present

## 2024-09-06 DIAGNOSIS — X58XXXA Exposure to other specified factors, initial encounter: Secondary | ICD-10-CM | POA: Insufficient documentation

## 2024-09-06 DIAGNOSIS — M19012 Primary osteoarthritis, left shoulder: Secondary | ICD-10-CM | POA: Insufficient documentation

## 2024-09-06 DIAGNOSIS — M65812 Other synovitis and tenosynovitis, left shoulder: Secondary | ICD-10-CM | POA: Diagnosis not present

## 2024-09-06 DIAGNOSIS — M25812 Other specified joint disorders, left shoulder: Secondary | ICD-10-CM | POA: Diagnosis not present

## 2024-09-06 DIAGNOSIS — G8929 Other chronic pain: Secondary | ICD-10-CM | POA: Diagnosis not present

## 2024-09-06 HISTORY — PX: POSTERIOR LUMBAR FUSION 2 WITH HARDWARE REMOVAL: SHX7297

## 2024-09-06 LAB — BASIC METABOLIC PANEL WITH GFR
Anion gap: 8 (ref 5–15)
BUN: 9 mg/dL (ref 8–23)
CO2: 29 mmol/L (ref 22–32)
Calcium: 9 mg/dL (ref 8.9–10.3)
Chloride: 102 mmol/L (ref 98–111)
Creatinine, Ser: 0.5 mg/dL — ABNORMAL LOW (ref 0.61–1.24)
GFR, Estimated: >60 mL/min (ref >60)
Glucose, Bld: 101 mg/dL — ABNORMAL HIGH (ref 70–99)
Potassium: 3.3 mmol/L — ABNORMAL LOW (ref 3.5–5.1)
Sodium: 139 mmol/L (ref 135–145)

## 2024-09-06 LAB — CBC
HCT: 41.8 % (ref 39.0–52.0)
Hemoglobin: 14 g/dL (ref 13.0–17.0)
MCH: 30.6 pg (ref 26.0–34.0)
MCHC: 33.5 g/dL (ref 30.0–36.0)
MCV: 91.3 fL (ref 80.0–100.0)
Platelets: 306 K/uL (ref 150–400)
RBC: 4.58 MIL/uL (ref 4.22–5.81)
RDW: 13.5 % (ref 11.5–15.5)
WBC: 4.3 K/uL (ref 4.0–10.5)
nRBC: 0 % (ref 0.0–0.2)

## 2024-09-06 SURGERY — ARTHROSCOPY, SHOULDER WITH DEBRIDEMENT
Anesthesia: General | Site: Shoulder | Laterality: Left

## 2024-09-06 MED ORDER — LABETALOL HCL 5 MG/ML IV SOLN
INTRAVENOUS | Status: AC
  Filled 2024-09-06: qty 4

## 2024-09-06 MED ORDER — OXYCODONE-ACETAMINOPHEN 5-325 MG PO TABS: 1.0000 | ORAL_TABLET | ORAL | 0 refills | Status: AC | PRN

## 2024-09-06 MED ORDER — PHENYLEPHRINE 80 MCG/ML (10ML) SYRINGE FOR IV PUSH (FOR BLOOD PRESSURE SUPPORT)
PREFILLED_SYRINGE | INTRAVENOUS | Status: AC
  Filled 2024-09-06: qty 10

## 2024-09-06 MED ORDER — ACETAMINOPHEN 500 MG PO TABS
1000.0000 mg | ORAL_TABLET | Freq: Once | ORAL | Status: AC
  Administered 2024-09-06: 1000 mg via ORAL
  Filled 2024-09-06: qty 2

## 2024-09-06 MED ORDER — GLYCOPYRROLATE 0.2 MG/ML IJ SOLN
INTRAMUSCULAR | Status: AC
  Filled 2024-09-06: qty 1

## 2024-09-06 MED ORDER — FENTANYL CITRATE (PF) 100 MCG/2ML IJ SOLN
INTRAMUSCULAR | Status: AC
  Filled 2024-09-06: qty 2

## 2024-09-06 MED ORDER — CELECOXIB 200 MG PO CAPS
200.0000 mg | ORAL_CAPSULE | Freq: Once | ORAL | Status: AC
  Administered 2024-09-06: 200 mg via ORAL
  Filled 2024-09-06: qty 1

## 2024-09-06 MED ORDER — LIDOCAINE HCL (PF) 2 % IJ SOLN
INTRAMUSCULAR | Status: AC
Start: 2024-09-06 — End: 2024-09-06
  Filled 2024-09-06: qty 5

## 2024-09-06 MED ORDER — KETAMINE HCL 50 MG/5ML IJ SOSY
PREFILLED_SYRINGE | INTRAMUSCULAR | Status: DC | PRN
  Administered 2024-09-06: 30 mg via INTRAVENOUS

## 2024-09-06 MED ORDER — EPHEDRINE 5 MG/ML INJ
INTRAVENOUS | Status: AC
  Filled 2024-09-06: qty 5

## 2024-09-06 MED ORDER — BUPIVACAINE HCL (PF) 0.5 % IJ SOLN
INTRAMUSCULAR | Status: DC | PRN
  Administered 2024-09-06: 30 mL

## 2024-09-06 MED ORDER — ROCURONIUM BROMIDE 10 MG/ML (PF) SYRINGE
PREFILLED_SYRINGE | INTRAVENOUS | Status: AC
  Filled 2024-09-06: qty 10

## 2024-09-06 MED ORDER — ORAL CARE MOUTH RINSE: 15.0000 mL | Freq: Once | OROMUCOSAL | Status: AC

## 2024-09-06 MED ORDER — LABETALOL HCL 5 MG/ML IV SOLN
10.0000 mg | INTRAVENOUS | Status: DC | PRN
  Administered 2024-09-06: 10 mg via INTRAVENOUS

## 2024-09-06 MED ORDER — CYCLOBENZAPRINE HCL 10 MG PO TABS: 10.0000 mg | ORAL_TABLET | Freq: Three times a day (TID) | ORAL | 1 refills | Status: AC | PRN

## 2024-09-06 MED ORDER — HYDROMORPHONE HCL 1 MG/ML IJ SOLN: 0.2500 mg | INTRAMUSCULAR | Status: DC | PRN

## 2024-09-06 MED ORDER — HYDRALAZINE HCL 20 MG/ML IJ SOLN
10.0000 mg | Freq: Once | INTRAMUSCULAR | Status: AC
  Administered 2024-09-06: 10 mg via INTRAVENOUS

## 2024-09-06 MED ORDER — PROPOFOL 10 MG/ML IV BOLUS
INTRAVENOUS | Status: AC
  Filled 2024-09-06: qty 20

## 2024-09-06 MED ORDER — GLYCOPYRROLATE 0.2 MG/ML IJ SOLN
INTRAMUSCULAR | Status: DC | PRN
  Administered 2024-09-06: .2 mg via INTRAVENOUS

## 2024-09-06 MED ORDER — ESMOLOL HCL 100 MG/10ML IV SOLN
INTRAVENOUS | Status: AC
  Filled 2024-09-06: qty 10

## 2024-09-06 MED ORDER — METOPROLOL TARTRATE 5 MG/5ML IV SOLN
INTRAVENOUS | Status: DC | PRN
  Administered 2024-09-06: 2 mg via INTRAVENOUS

## 2024-09-06 MED ORDER — CHLORHEXIDINE GLUCONATE 0.12 % MT SOLN
15.0000 mL | Freq: Once | OROMUCOSAL | Status: AC
  Administered 2024-09-06: 15 mL via OROMUCOSAL

## 2024-09-06 MED ORDER — ONDANSETRON HCL 4 MG/2ML IJ SOLN
INTRAMUSCULAR | Status: AC
  Filled 2024-09-06: qty 2

## 2024-09-06 MED ORDER — MELOXICAM 15 MG PO TABS: 15.0000 mg | ORAL_TABLET | Freq: Every day | ORAL | 2 refills | Status: AC

## 2024-09-06 MED ORDER — ESMOLOL HCL 100 MG/10ML IV SOLN
INTRAVENOUS | Status: DC | PRN
  Administered 2024-09-06: 50 mg via INTRAVENOUS

## 2024-09-06 MED ORDER — DROPERIDOL 2.5 MG/ML IJ SOLN: 0.6250 mg | Freq: Once | INTRAMUSCULAR | Status: DC | PRN

## 2024-09-06 MED ORDER — LIDOCAINE HCL (CARDIAC) PF 100 MG/5ML IV SOSY
PREFILLED_SYRINGE | INTRAVENOUS | Status: DC | PRN
  Administered 2024-09-06: 60 mg via INTRAVENOUS

## 2024-09-06 MED ORDER — MIDAZOLAM HCL 5 MG/5ML IJ SOLN
INTRAMUSCULAR | Status: DC | PRN
  Administered 2024-09-06: 2 mg via INTRAVENOUS

## 2024-09-06 MED ORDER — PHENYLEPHRINE HCL-NACL 20-0.9 MG/250ML-% IV SOLN
INTRAVENOUS | Status: DC
Start: 2024-09-06 — End: 2024-09-06
  Filled 2024-09-06: qty 250

## 2024-09-06 MED ORDER — KETAMINE HCL 50 MG/5ML IJ SOSY
PREFILLED_SYRINGE | INTRAMUSCULAR | Status: AC
  Filled 2024-09-06: qty 5

## 2024-09-06 MED ORDER — MIDAZOLAM HCL (PF) 2 MG/2ML IJ SOLN: 1.0000 mg | Freq: Once | INTRAMUSCULAR | Status: DC

## 2024-09-06 MED ORDER — SUGAMMADEX SODIUM 200 MG/2ML IV SOLN
INTRAVENOUS | Status: DC | PRN
  Administered 2024-09-06: 200 mg via INTRAVENOUS

## 2024-09-06 MED ORDER — FENTANYL CITRATE (PF) 50 MCG/ML IJ SOSY: 50.0000 ug | PREFILLED_SYRINGE | Freq: Once | INTRAMUSCULAR | Status: DC

## 2024-09-06 MED ORDER — PROPOFOL 10 MG/ML IV BOLUS
INTRAVENOUS | Status: DC | PRN
  Administered 2024-09-06: 170 mg via INTRAVENOUS

## 2024-09-06 MED ORDER — ROCURONIUM BROMIDE 100 MG/10ML IV SOLN
INTRAVENOUS | Status: DC | PRN
  Administered 2024-09-06: 60 mg via INTRAVENOUS

## 2024-09-06 MED ORDER — HYDRALAZINE HCL 20 MG/ML IJ SOLN
INTRAMUSCULAR | Status: AC
  Filled 2024-09-06: qty 1

## 2024-09-06 MED ORDER — ONDANSETRON HCL 4 MG/2ML IJ SOLN
INTRAMUSCULAR | Status: DC | PRN
  Administered 2024-09-06: 4 mg via INTRAVENOUS

## 2024-09-06 MED ORDER — ONDANSETRON HCL 4 MG PO TABS: 4.0000 mg | ORAL_TABLET | Freq: Three times a day (TID) | ORAL | 0 refills | Status: AC | PRN

## 2024-09-06 MED ORDER — CEFAZOLIN SODIUM-DEXTROSE 2-4 GM/100ML-% IV SOLN
2.0000 g | INTRAVENOUS | Status: AC
  Administered 2024-09-06: 2 g via INTRAVENOUS
  Filled 2024-09-06: qty 100

## 2024-09-06 MED ORDER — MIDAZOLAM HCL 2 MG/2ML IJ SOLN
INTRAMUSCULAR | Status: AC
  Filled 2024-09-06: qty 2

## 2024-09-06 MED ORDER — BUPIVACAINE HCL (PF) 0.5 % IJ SOLN
INTRAMUSCULAR | Status: AC
  Filled 2024-09-06: qty 30

## 2024-09-06 MED ORDER — METOPROLOL TARTRATE 5 MG/5ML IV SOLN
INTRAVENOUS | Status: AC
  Filled 2024-09-06: qty 5

## 2024-09-06 MED ORDER — PHENYLEPHRINE 80 MCG/ML (10ML) SYRINGE FOR IV PUSH (FOR BLOOD PRESSURE SUPPORT)
PREFILLED_SYRINGE | INTRAVENOUS | Status: DC | PRN
  Administered 2024-09-06: 160 ug via INTRAVENOUS

## 2024-09-06 MED ORDER — FENTANYL CITRATE (PF) 100 MCG/2ML IJ SOLN
INTRAMUSCULAR | Status: DC | PRN
  Administered 2024-09-06 (×3): 50 ug via INTRAVENOUS

## 2024-09-06 MED ORDER — SUGAMMADEX SODIUM 200 MG/2ML IV SOLN
INTRAVENOUS | Status: AC
  Filled 2024-09-06: qty 4

## 2024-09-06 MED ORDER — OXYCODONE HCL 5 MG/5ML PO SOLN: 5.0000 mg | Freq: Once | ORAL | Status: DC | PRN

## 2024-09-06 MED ORDER — OXYCODONE HCL 5 MG PO TABS: 5.0000 mg | ORAL_TABLET | Freq: Once | ORAL | Status: DC | PRN

## 2024-09-06 MED ORDER — LACTATED RINGERS IV SOLN: INTRAVENOUS | Status: DC

## 2024-09-06 MED ORDER — SODIUM CHLORIDE 0.9 % IR SOLN
Status: DC | PRN
  Administered 2024-09-06: 6000 mL

## 2024-09-06 SURGICAL SUPPLY — 57 items
BAG COUNTER SPONGE SURGICOUNT (BAG) ×1 IMPLANT
BLADE EXCALIBUR 4.0X13 (MISCELLANEOUS) IMPLANT
BLADE SURG SZ11 CARB STEEL (BLADE) ×1 IMPLANT
BNDG COHESIVE 4X5 TAN STRL LF (GAUZE/BANDAGES/DRESSINGS) ×1 IMPLANT
BOOTIES KNEE HIGH SLOAN (MISCELLANEOUS) ×2 IMPLANT
BURR OVAL 8 FLU 4.0X13 (MISCELLANEOUS) ×1 IMPLANT
CANNULA DRILOCK 5.0X75 (CANNULA) IMPLANT
CANNULA TWIST IN 8.25X7CM (CANNULA) IMPLANT
CHLORAPREP W/TINT 26 (MISCELLANEOUS) ×2 IMPLANT
CONNECTOR 5 IN 1 STRAIGHT STRL (MISCELLANEOUS) ×1 IMPLANT
COOLER ICEMAN CLASSIC (MISCELLANEOUS) ×1 IMPLANT
COVER MAYO STAND STRL (DRAPES) ×1 IMPLANT
COVER SURGICAL LIGHT HANDLE (MISCELLANEOUS) ×1 IMPLANT
DRAPE INCISE 23X17 STRL (DRAPES) ×2 IMPLANT
DRAPE ORTHO 2.5IN SPLIT 77X108 (DRAPES) ×2 IMPLANT
DRAPE STERI 35X30 U-POUCH (DRAPES) IMPLANT
DRAPE SURG 17X11 SM STRL (DRAPES) ×1 IMPLANT
DRAPE U-SHAPE 47X51 STRL (DRAPES) IMPLANT
DRSG MEPILEX SACRM 8.7X9.8 (GAUZE/BANDAGES/DRESSINGS) IMPLANT
GAUZE 4X4 16PLY ~~LOC~~+RFID DBL (SPONGE) ×1 IMPLANT
GAUZE PAD ABD 8X10 STRL (GAUZE/BANDAGES/DRESSINGS) ×2 IMPLANT
GAUZE SPONGE 4X4 12PLY STRL (GAUZE/BANDAGES/DRESSINGS) ×1 IMPLANT
GLOVE BIO SURGEON STRL SZ7.5 (GLOVE) ×1 IMPLANT
GLOVE BIO SURGEON STRL SZ8 (GLOVE) ×1 IMPLANT
GLOVE SS BIOGEL STRL SZ 7.5 (GLOVE) ×1 IMPLANT
GOWN STRL SURGICAL XL XLNG (GOWN DISPOSABLE) ×2 IMPLANT
KIT BASIN OR (CUSTOM PROCEDURE TRAY) ×1 IMPLANT
KIT SHOULDER TRACTION (DRAPES) ×1 IMPLANT
MANIFOLD NEPTUNE II (INSTRUMENTS) ×1 IMPLANT
NDL SCORPION MULTI FIRE (NEEDLE) IMPLANT
NDL SPNL 18GX3.5 QUINCKE PK (NEEDLE) ×1 IMPLANT
NDL TAPERED W/ NITINOL LOOP (MISCELLANEOUS) IMPLANT
NEEDLE SCORPION MULTI FIRE (NEEDLE) IMPLANT
NEEDLE SPNL 18GX3.5 QUINCKE PK (NEEDLE) ×1 IMPLANT
NEEDLE TAPERED W/ NITINOL LOOP (MISCELLANEOUS) IMPLANT
NS IRRIG 1000ML POUR BTL (IV SOLUTION) ×1 IMPLANT
PACK ARTHROSCOPY WL (CUSTOM PROCEDURE TRAY) ×1 IMPLANT
PAD COLD SHLDR WRAP-ON (PAD) ×1 IMPLANT
PROTECTOR NERVE ULNAR (MISCELLANEOUS) ×2 IMPLANT
SLING ARM FOAM STRAP LRG (SOFTGOODS) IMPLANT
SLING ARM IMMOBILIZER LRG (SOFTGOODS) ×1 IMPLANT
SLING ARM IMMOBILIZER MED (SOFTGOODS) ×1 IMPLANT
SPIKE FLUID TRANSFER (MISCELLANEOUS) ×1 IMPLANT
SPONGE T-LAP 4X18 ~~LOC~~+RFID (SPONGE) IMPLANT
STRIP CLOSURE SKIN 1/2X4 (GAUZE/BANDAGES/DRESSINGS) ×1 IMPLANT
SUT MNCRL AB 3-0 PS2 18 (SUTURE) ×1 IMPLANT
SUT PDS AB 0 CT 36 (SUTURE) IMPLANT
SUT PDS AB 1 CT 36 (SUTURE) ×1 IMPLANT
SUT TIGER TAPE 7 IN WHITE (SUTURE) IMPLANT
SYR 30ML LL (SYRINGE) IMPLANT
TAPE FIBER 2MM 7IN #2 BLUE (SUTURE) IMPLANT
TAPE PAPER 3X10 WHT MICROPORE (GAUZE/BANDAGES/DRESSINGS) ×1 IMPLANT
TOWEL GREEN STERILE FF (TOWEL DISPOSABLE) ×1 IMPLANT
TOWEL OR DSP ST BLU DLX 10/PK (DISPOSABLE) ×1 IMPLANT
TUBING ARTHROSCOPY IRRIG 16FT (MISCELLANEOUS) ×1 IMPLANT
TUBING CONNECTING 10 (TUBING) ×1 IMPLANT
WATER STERILE IRR 1000ML POUR (IV SOLUTION) ×1 IMPLANT

## 2024-09-06 NOTE — Transfer of Care (Signed)
 Immediate Anesthesia Transfer of Care Note  Patient: Jon Wells Masters  Procedure(s) Performed: ARTHROSCOPY, SHOULDER WITH DEBRIDEMENT (Left: Shoulder)  Patient Location: PACU  Anesthesia Type:General  Level of Consciousness: awake, alert , and oriented  Airway & Oxygen  Therapy: Patient Spontanous Breathing and Patient connected to face mask oxygen   Post-op Assessment: Report given to RN and Post -op Vital signs reviewed and stable  Post vital signs: Reviewed and stable  Last Vitals:  Vitals Value Taken Time  BP 177/113 09/06/24 13:33  Temp    Pulse 86 09/06/24 13:35  Resp 11 09/06/24 13:35  SpO2 100 % 09/06/24 13:35  Vitals shown include unfiled device data.  Last Pain:  Vitals:   09/06/24 1008  TempSrc: Oral  PainSc: 0-No pain         Complications: No notable events documented.

## 2024-09-06 NOTE — Op Note (Signed)
 09/06/2024  1:22 PM  PATIENT:   Jon Wells  68 y.o. male  PRE-OPERATIVE DIAGNOSIS:  Left shoulder pain, bursitis, impingement, arthritis  POST-OPERATIVE DIAGNOSIS: Same with additional finding of chronic rotator cuff tear and severe bicipital tendinopathy as well as labral tear and synovitis  PROCEDURE: Left shoulder arthroscopy with extensive debridement as well as biceps tenotomy.  Debridement included labrum, synovectomy, torn rotator cuff, torn bicep tendon, subacromial and subdeltoid bursal tissues.  SURGEON:  Melita Franky BATTLE M.D.  ASSISTANTS: Randine Ricks, PA-C  Randine Ricks, PA-C was utilized as an geophysicist/field seismologist throughout this case, essential for help with positioning the patient, positioning extremity, tissue manipulation, implantation of the prosthesis, suture management, wound closure, and intraoperative decision-making.  ANESTHESIA:   General Endotracheal.  No block was used at the patient's request to allow him immediate use of the left arm within pain tolerance.  EBL: Minimal  SPECIMEN: None  Drains: None   PATIENT DISPOSITION:  PACU - hemodynamically stable.    PLAN OF CARE: Discharge to home after PACU  Brief history:  Mr. Jon Wells is a 68 year old gentleman long-term paraplegic after a fall from a tree who is highly functional and utilizes an electric wheelchair and has been followed for chronic bilateral shoulder pain related to underlying arthritis and rotator cuff degeneration.  His left shoulder is most symptomatic.  We have discussed the possibility of reverse shoulder arthroplasty.  However he he needs to fully weight-bear through his shoulders to transfer and he is very reluctant to consider the possibility of even short-term inability to transfer and weight-bear on his shoulder and so from a symptomatic standpoint we discussed an arthroscopic debridement to see if we can improve his overall functionality and pain.  Preoperatively we did discuss at length the  potential risk versus benefits of surgery to include the possible complications of bleeding, infection, neurovascular injury, persistence of pain, loss of motion, anesthetic complication, and possible need for additional surgery.  She understands, and accepts, and agrees with the planned procedure.  Procedure in detail:  After undergoing routine preop evaluation the patient received prophylactic antibiotics and interscalene block with Exparel  established in the holding area by the anesthesia department.  Subsequently placed spine on the operating table and underwent a smooth induction of a general endotracheal anesthesia.  Turned to the right lateral decubitus position on a beanbag and appropriately padded and protected.  Left shoulder examination under anesthesia revealed full motion.  No instability noted.  Left arm suspended at 70 degrees of abduction with 15 pounds of traction in the left shoulder girdle region was sterilely prepped and draped in standard fashion.  Timeout was called.  A posterior portal established into the glenohumeral joint and anterior portal established under direct visualization.  There was evidence for severe diffuse synovitis and extensive synovectomy was performed.  There was circumferential labral debridement performed for the treatment of degenerative labral tearing.  Chondroplasty performed of the glenoid although I did not appreciate any area of full-thickness chondral loss.  The rotator cuff was chronically and massively torn and they have torn margin was trimmed with a shaver back to healthy viable tissue.  The long head biceps tendon was severely degenerated and frayed and we performed a tenotomy and resected the residual proximal portion of the long head biceps tendon allowing it to retract distally.  At this point the arm was then dropped down to 30 degrees of abduction with the arthroscope we directed towards the subacromial/subdeltoid region and a direct lateral portal  established into the subacromial space we performed an extensive bursectomy and lysis of adhesions.  There was evidence for abrasion between the apex of the tuberosity and the undersurface of the acromion and this interval was debrided of interposed soft tissue and inflammatory changes.  The torn margin of the rotator cuff was also trimmed.  The bursectomy was then completed.  Meticulous hemostasis was obtained.  Fluid and its was removed.  The portals were closed with a Monocryl and a Steri-Strip.  A dry dressing taped over the left shoulder left arm was placed into a sling and the patient was awakened, extubated, taken to recovery in stable condition.  Franky CHRISTELLA Pointer MD   Contact # [phone]

## 2024-09-06 NOTE — H&P (Signed)
 Jon Wells Masters    Chief Complaint: Left shoulder pain, bursitis, impingement, arthritis HPI: The patient is a 68 y.o. male long-term paraplegic with known chronic bilateral shoulder pain related to advanced osteoarthritis.  His left side is most symptomatic.  We previously discussed the possibility of shoulder arthroplasty although the patient is very reluctant to consider this option due to the need for nonweightbearing after surgery.  He does rely upon weightbearing through both shoulders for all of his transfers.  We have discussed proceeding with arthroscopic debridement of the left shoulder to see if we can gain him some symptomatic relief.  Past Medical History:  Diagnosis Date   Hyperlipemia    Hypertension    Immobility syndrome (paraplegic)       Past Surgical History:  Procedure Laterality Date   BACK SURGERY     20 years ago   COLONOSCOPY  2008   Dr Anette    COLONOSCOPY N/A 08/03/2020   Procedure: COLONOSCOPY;  Surgeon: Charlanne Groom, MD;  Location: [facility];  Service: Endoscopy;  Laterality: N/A;  No Sedation required   CYSTOSCOPY  05/19/2020   [facility]     Family History  Problem Relation Age of Onset   Colon cancer Neg Hx    Esophageal cancer Neg Hx     Social History:  reports that he has quit smoking. He has never used smokeless tobacco. He reports current alcohol use. He reports that he does not use drugs.  BMI: Estimated body mass index is 25.77 kg/m as calculated from the following:   Height as of this encounter: 6' (1.829 m).   Weight as of this encounter: 86.2 kg.  No results found for: ALBUMIN Diabetes: Patient does not have a diagnosis of diabetes.     Smoking Status:       Medications Prior to Admission  Medication Sig Dispense Refill   Colesevelam HCl 3.75 g PACK Take 1 packet by mouth daily.     hydrOXYzine (ATARAX/VISTARIL) 50 MG tablet Take 50 mg by mouth daily as needed for itching.     lisinopril-hydrochlorothiazide  (ZESTORETIC) 20-12.5 MG tablet Take 0.5 tablets by mouth 2 (two) times daily.     oxybutynin (DITROPAN) 5 MG tablet Take 2.5 mg by mouth 2 (two) times daily.       Physical Exam: Left shoulder demonstrates functional motion but he has obvious severe pain with both active and passive motion as noted at his recent office visit.  He has good strength to manual testing.  Neurovascular intact.  Plain radiographs confirm changes consistent with a chronic left shoulder rotator cuff tear arthropathy.  Vitals  Temp:  [97.9 F (36.6 C)] 97.9 F (36.6 C) (11/21 1008) Pulse Rate:  [60] 60 (11/21 1008) Resp:  [16] 16 (11/21 1008) BP: (175)/(78) 175/78 (11/21 1008) SpO2:  [100 %] 100 % (11/21 1008) Weight:  [86.2 kg] 86.2 kg (11/21 1008)  Assessment/Plan  Impression: Left shoulder pain, bursitis, impingement, arthritis  Plan of Action: Procedure(s): ARTHROSCOPY, SHOULDER WITH DEBRIDEMENT  Elisandro Jarrett M Kamarius Buckbee 09/06/2024, 11:56 AM Contact # [phone]

## 2024-09-06 NOTE — Anesthesia Procedure Notes (Addendum)
 Procedure Name: Intubation Date/Time: 09/06/2024 12:24 PM  Performed by: Nada Corean CROME, CRNAPre-anesthesia Checklist: Emergency Drugs available, Patient identified, Suction available, Patient being monitored and Timeout performed Patient Re-evaluated:Patient Re-evaluated prior to induction Oxygen  Delivery Method: Circle system utilized Preoxygenation: Pre-oxygenation with 100% oxygen  Induction Type: IV induction Ventilation: Mask ventilation without difficulty Laryngoscope Size: Miller and 3 Grade View: Grade III Tube type: Oral Tube size: 7.5 mm Number of attempts: 2 Airway Equipment and Method: Stylet Placement Confirmation: ETT inserted through vocal cords under direct vision, positive ETCO2 and breath sounds checked- equal and bilateral Secured at: 23 cm Tube secured with: Tape Dental Injury: Teeth and Oropharynx as per pre-operative assessment  Difficulty Due To: Difficult Airway- due to anterior larynx and Difficulty was anticipated

## 2024-09-06 NOTE — Anesthesia Postprocedure Evaluation (Signed)
 Anesthesia Post Note  Patient: Jon Wells  Procedure(s) Performed: ARTHROSCOPY, SHOULDER WITH DEBRIDEMENT (Left: Shoulder)     Patient location during evaluation: PACU Anesthesia Type: General Level of consciousness: awake and alert Pain management: pain level controlled Vital Signs Assessment: post-procedure vital signs reviewed and stable Respiratory status: spontaneous breathing, nonlabored ventilation, respiratory function stable and patient connected to nasal cannula oxygen  Cardiovascular status: blood pressure returned to baseline and stable Postop Assessment: no apparent nausea or vomiting Anesthetic complications: no   No notable events documented.  Last Vitals:  Vitals:   09/06/24 1457 09/06/24 1459  BP: (!) 146/93 (!) 146/93  Pulse: 85 85  Resp: 18 20  Temp:  (!) 36.2 C  SpO2: 100% 100%    Last Pain:  Vitals:   09/06/24 1459  TempSrc: Oral  PainSc: 0-No pain                 Cordella P [name]

## 2024-09-06 NOTE — Discharge Instructions (Signed)
   Kevin M. Supple, M.D., F.A.A.O.S. [facility] Specializing in Arthroscopic and Reconstructive Surgery of the Shoulder [phone] [address] - [place], [place] [address] - Fax [phone]   POST-OP SHOULDER ARTHROSCOPY INSTRUCTIONS  1. Call the office at [phone] to schedule your first post-op appointment 7-10 days from the date of your surgery.  2. Leave the steri-strips in place over your incisions when performing dressing changes and showering. You may remove your dressings and begin showering 72 hours from surgery. You can expect drainage that is clear to bloody in nature that occasionally will soak through your dressings. If this occurs go ahead and perform a dressing change. The drainage should lessen daily and when there is no drainage from your incisions feel free to go without a dressing.  3. Wear your sling for comfort. You may come out of your sling for ad lib activity and even decide not to use the sling at all. If you find you are more comfortable in your sling, make sure you come out of your sling at least 3-4 times a day to do the exercises that are included below.  4. Range of motion to your elbow, wrist, and hand are encouraged 3-5 times daily. Exercise to your hand and fingers helps to reduce swelling you may experience.  5. Utilize ice to the shoulder 3-4 times minimum a day and additionally if you are experiencing pain.  6. You may drive when safely off narcotics and muscle relaxants.  7.Pain control following an exparel block  To help control your post-operative pain you received a nerve block  performed with Exparel which is a long acting anesthetic (numbing agent) which can provide pain relief and sensations of numbness (and relief of pain) in the operative shoulder and arm for up to 3 days. Sometimes it provides mixed relief, meaning you may still have numbness in certain areas of the arm but can still be able to move  parts of that arm,  hand, and fingers. We recommend that your prescribed pain medications  be used as needed. We do not feel it is necessary to "pre medicate" and "stay ahead" of pain.  Taking narcotic pain medications when you are not having any pain can lead to unnecessary and potentially dangerous side effects.    8. Pain medications can produce constipation along with their use. If you experience this, the use of an over the counter stool softener or laxative daily is recommended.   9. For additional questions or concerns, please do not hesitate to call the office. If after hours there is an answering service to forward your concerns to the physician on call.   POST-OP EXERCISES  The pendulum exercises should be performed while bending at the waist as far over as possible thereby letting gravity do the work for you.  Range of Motion Exercises: Pendulum (circular)  Repeat 20 times. Do 3 sessions per day.     Range of Motion Exercises: Pendulum (side-to-side)  Repeat 20 times. Do 3 sessions per day.    Range of Motion Exercises (self-stretching activities):  Slide arm up wall with palm toward you, moving closer to the wall. Hold for 5 seconds.  Repeat 10 times. Do 3 sessions per day.       

## 2024-09-06 NOTE — Anesthesia Preprocedure Evaluation (Signed)
 Anesthesia Evaluation  Patient identified by MRN, date of birth, ID band Patient awake    Reviewed: Allergy & Precautions, NPO status , Patient's Chart, lab work & pertinent test results  Airway Mallampati: II  TM Distance: >3 FB Neck ROM: Full    Dental no notable dental hx.    Pulmonary neg pulmonary ROS, former smoker   Pulmonary exam normal        Cardiovascular hypertension, Pt. on medications  Rhythm:Regular Rate:Normal     Neuro/Psych negative neurological ROS  negative psych ROS   GI/Hepatic negative GI ROS, Neg liver ROS,,,  Endo/Other  negative endocrine ROS    Renal/GU negative Renal ROS  negative genitourinary   Musculoskeletal negative musculoskeletal ROS (+)    Abdominal Normal abdominal exam  (+)   Peds  Hematology Lab Results      Component                Value               Date                      WBC                      4.3                 09/06/2024                HGB                      14.0                09/06/2024                HCT                      41.8                09/06/2024                MCV                      91.3                09/06/2024                PLT                      306                 09/06/2024             Lab Results      Component                Value               Date                      CREATININE               0.60 (L)            06/29/2020              Anesthesia Other Findings   Reproductive/Obstetrics  Anesthesia Physical Anesthesia Plan  ASA: 3  Anesthesia Plan: General   Post-op Pain Management: Tylenol  PO (pre-op)*, Celebrex  PO (pre-op)* and Ketamine  IV*   Induction: Intravenous  PONV Risk Score and Plan: 2 and Ondansetron , Dexamethasone  and Treatment may vary due to age or medical condition  Airway Management Planned: Mask and Oral ETT  Additional Equipment: None  Intra-op  Plan:   Post-operative Plan: Extubation in OR  Informed Consent: I have reviewed the patients History and Physical, chart, labs and discussed the procedure including the risks, benefits and alternatives for the proposed anesthesia with the patient or authorized representative who has indicated his/her understanding and acceptance.     Dental advisory given  Plan Discussed with: CRNA  Anesthesia Plan Comments: (- patient not interested in PNB at this time given his paraplegic status and concern for one working arm early in my recovery. )        Anesthesia Quick Evaluation

## 2024-09-07 ENCOUNTER — Encounter (HOSPITAL_COMMUNITY): Payer: Self-pay | Admitting: Orthopedic Surgery

## 2024-10-03 ENCOUNTER — Inpatient Hospital Stay (HOSPITAL_COMMUNITY): Admit: 2024-10-03 | Admitting: Orthopedic Surgery

## 2024-10-03 SURGERY — ARTHROPLASTY, SHOULDER, TOTAL, REVERSE
Anesthesia: General | Site: Shoulder | Laterality: Left

## 2024-10-22 ENCOUNTER — Encounter (HOSPITAL_COMMUNITY): Payer: Self-pay | Admitting: Orthopedic Surgery
